# Patient Record
Sex: Male | Born: 1975 | Race: Black or African American | Hispanic: No | Marital: Married | Smoking: Never smoker
Health system: Southern US, Community
[De-identification: ages and names within clinical notes are randomized; demographics above are authoritative.]

## PROBLEM LIST (undated history)

## (undated) DIAGNOSIS — I10 Essential (primary) hypertension: Secondary | ICD-10-CM

## (undated) DIAGNOSIS — Z8619 Personal history of other infectious and parasitic diseases: Secondary | ICD-10-CM

## (undated) DIAGNOSIS — E119 Type 2 diabetes mellitus without complications: Secondary | ICD-10-CM

## (undated) HISTORY — PX: HERNIA REPAIR: SHX51

## (undated) HISTORY — DX: Type 2 diabetes mellitus without complications: E11.9

## (undated) HISTORY — DX: Personal history of other infectious and parasitic diseases: Z86.19

---

## 2005-08-21 ENCOUNTER — Other Ambulatory Visit: Payer: Self-pay

## 2005-08-21 ENCOUNTER — Emergency Department: Payer: Self-pay | Admitting: Emergency Medicine

## 2008-10-25 HISTORY — PX: OTHER SURGICAL HISTORY: SHX169

## 2011-08-18 DIAGNOSIS — Z22322 Carrier or suspected carrier of Methicillin resistant Staphylococcus aureus: Secondary | ICD-10-CM | POA: Insufficient documentation

## 2011-12-21 LAB — CBC AND DIFFERENTIAL
HEMATOCRIT: 45 % (ref 41–53)
Hemoglobin: 15.2 g/dL (ref 13.5–17.5)
Platelets: 247 10*3/uL (ref 150–399)
WBC: 8.9 10*3/mL

## 2012-11-21 LAB — BASIC METABOLIC PANEL
BUN: 18 mg/dL (ref 4–21)
Creatinine: 0.9 mg/dL (ref 0.6–1.3)
GLUCOSE: 109 mg/dL
Potassium: 4.5 mmol/L (ref 3.4–5.3)
Sodium: 137 mmol/L (ref 137–147)

## 2012-11-21 LAB — LIPID PANEL
Cholesterol: 155 mg/dL (ref 0–200)
HDL: 41 mg/dL (ref 35–70)
LDL CALC: 88 mg/dL
Triglycerides: 131 mg/dL (ref 40–160)

## 2012-11-21 LAB — TSH: TSH: 2.08 u[IU]/mL (ref 0.41–5.90)

## 2012-11-21 LAB — HEPATIC FUNCTION PANEL
ALT: 15 U/L (ref 10–40)
AST: 24 U/L (ref 14–40)

## 2012-11-21 LAB — HEMOGLOBIN A1C: HEMOGLOBIN A1C: 7.3 % — AB (ref 4.0–6.0)

## 2012-11-21 LAB — PSA: PSA: 1.5

## 2012-11-28 LAB — HEMOGLOBIN A1C: Hgb A1c MFr Bld: 5.7 % (ref 4.0–6.0)

## 2013-07-27 ENCOUNTER — Ambulatory Visit: Payer: Self-pay | Admitting: Family Medicine

## 2014-11-25 IMAGING — CR DG HIP COMPLETE 2+V*L*
1 series · 2 of 2 positions shown · non-contrast
Comparison: none

REASON FOR EXAM: pain
COMMENTS:

PROCEDURE:     KDR - KDXR HIP LEFT COMPLETE  - July 27, 2013 [DATE]
RESULT:     AP and lateral views of the left hip reveal the bones to be
adequately mineralized. There is no evidence of a fracture nor dislocation.
The joint space is preserved. There are phleboliths within the pelvis.

[Series 1: ap · 0.17mm/px · 2 of 2 slices shown]
[im 1/2]
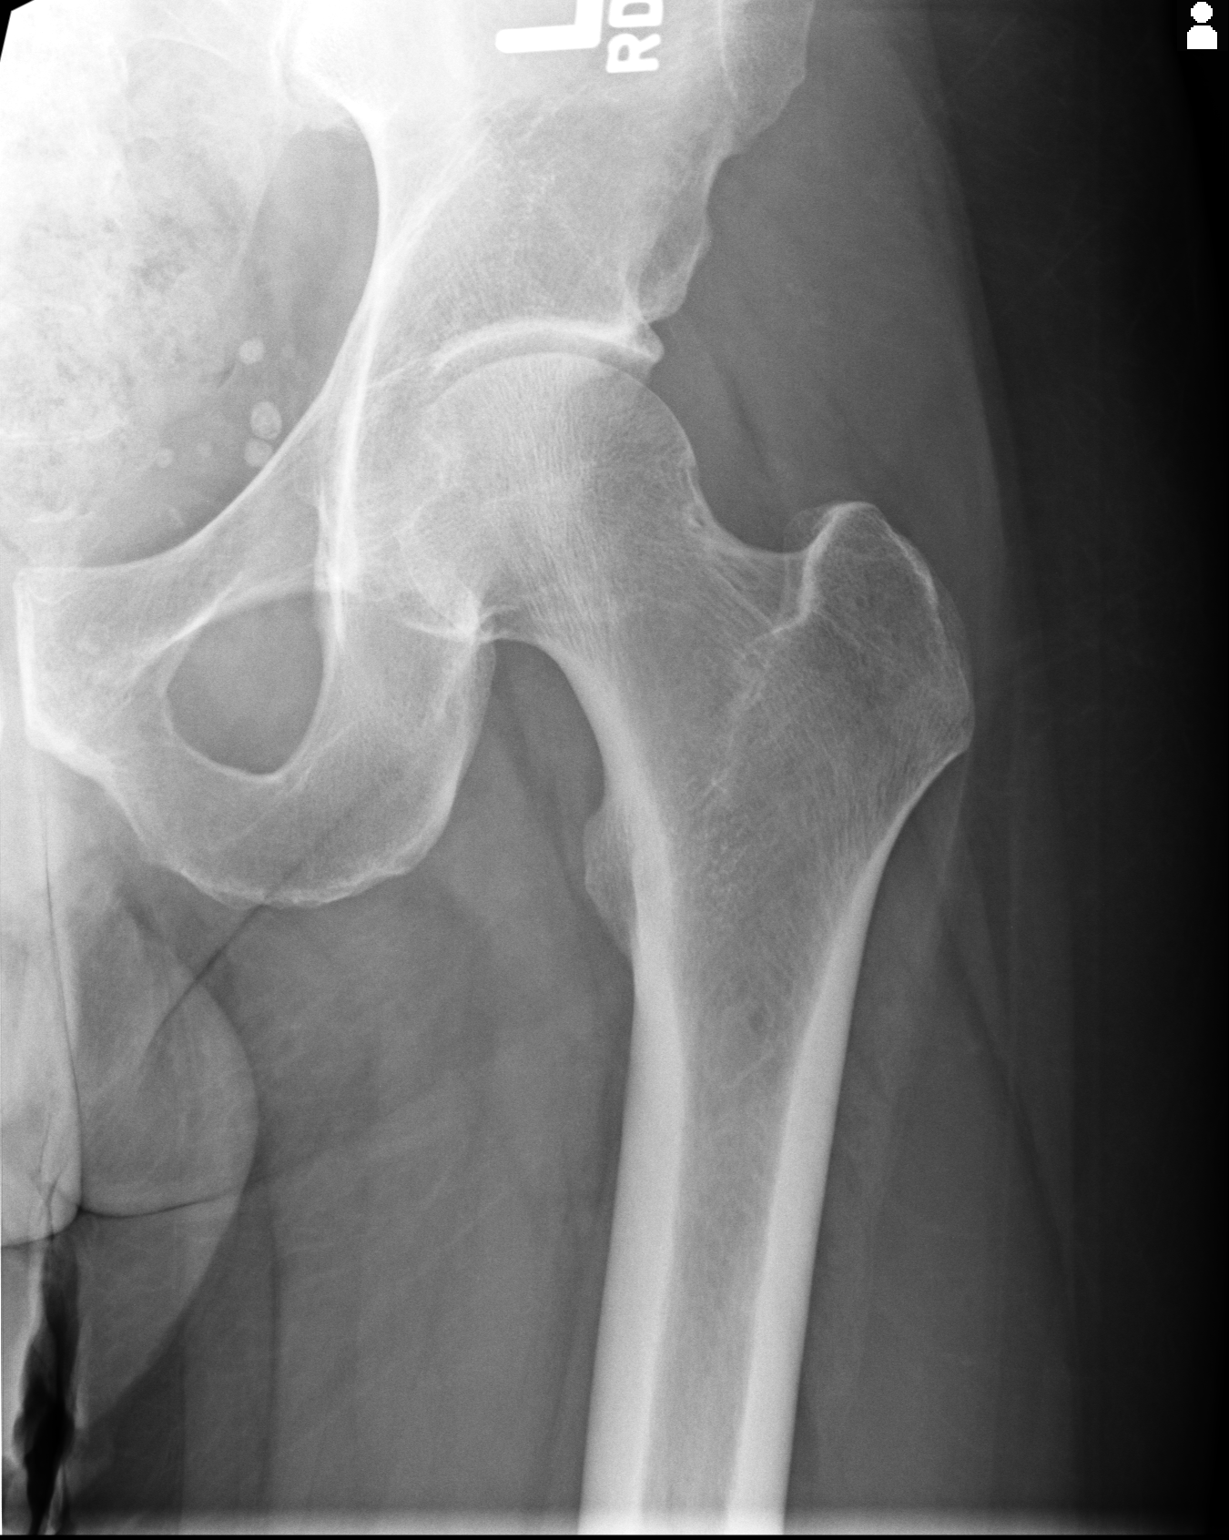
[im 2/2]
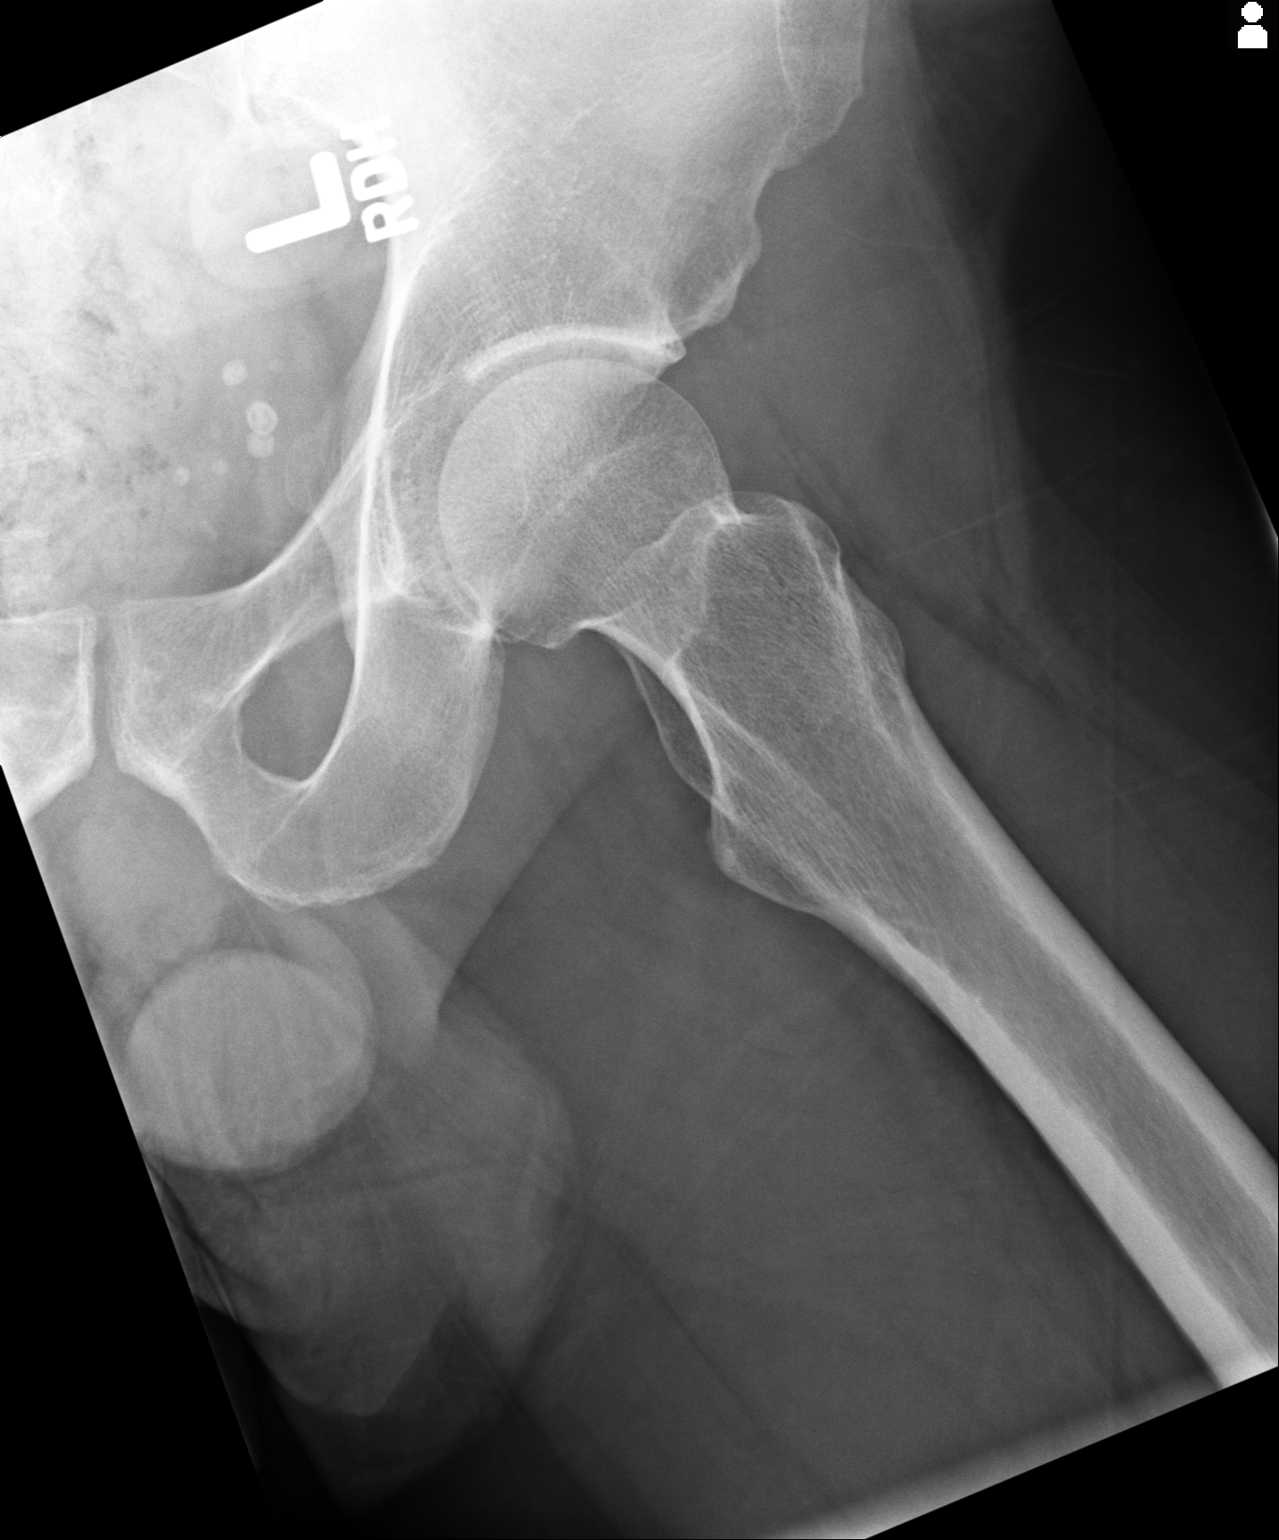

[2 of 2 positions shown; findings below may reference images not displayed]

IMPRESSION: There is no acute bony abnormality of the left hip.

[REDACTED]

## 2015-05-21 ENCOUNTER — Other Ambulatory Visit: Payer: Self-pay | Admitting: Family Medicine

## 2015-07-03 ENCOUNTER — Telehealth: Payer: Self-pay | Admitting: Family Medicine

## 2015-07-03 NOTE — Telephone Encounter (Signed)
Pt called wanitng to know if you would recommend a chiropractor to him.  He wants to know if Dr. Birdie Sons is ok for him to see.  His call back is [phone]  [facility]

## 2015-07-03 NOTE — Telephone Encounter (Signed)
I would recommend Dr. Francisca December

## 2015-07-04 NOTE — Telephone Encounter (Signed)
Patient notified

## 2015-07-29 DIAGNOSIS — Z8619 Personal history of other infectious and parasitic diseases: Secondary | ICD-10-CM | POA: Insufficient documentation

## 2015-07-29 DIAGNOSIS — M25552 Pain in left hip: Secondary | ICD-10-CM | POA: Insufficient documentation

## 2015-07-29 DIAGNOSIS — M549 Dorsalgia, unspecified: Secondary | ICD-10-CM | POA: Insufficient documentation

## 2015-07-29 DIAGNOSIS — R739 Hyperglycemia, unspecified: Secondary | ICD-10-CM | POA: Insufficient documentation

## 2015-07-29 DIAGNOSIS — R011 Cardiac murmur, unspecified: Secondary | ICD-10-CM | POA: Insufficient documentation

## 2015-07-29 DIAGNOSIS — K649 Unspecified hemorrhoids: Secondary | ICD-10-CM | POA: Insufficient documentation

## 2015-07-29 HISTORY — DX: Personal history of other infectious and parasitic diseases: Z86.19

## 2015-07-29 LAB — LIPID PANEL
Albumin, Serum: 4.4
Albumin/Globulin Ratio: 1.5
BASO(ABSOLUTE): 0.1
BUN / CREAT RATIO: 17
Basophil: 1
Bilirubin, Total: 0.2 mg/dL
Calcium, Ser: 9.3
Chloride, Serum: 102
Cholesterol: 142 mg/dL (ref 0–200)
EGFR (African American): 127
EGFR (Non-African Amer.): 110
EOS (ABSOLUTE): 0.3
Eosinophils Relative: 4 % (ref 0–6)
Free Thyroxine Index: 1.9
GGT: 12
GRANS (ABSOLUTE): 0
Globulin, Total: 3
Granulocytes:: 0
HDL: 52 mg/dL (ref 35–70)
IRON: 58
LDH: 158
LDL Cholesterol: 77 mg/dL
LDl/HDL Ratio: 2.7
LYMPHOCYTES RELATIVE % (KUC): 29 % (ref 15–45)
Lymphs(Absolute): 2.3
MCH: 28.4
MCHC: 33
MCV: 86
MONOCYTES RELATIVE % (KUC): 5 % (ref 2–10)
MONOCYTES(ABSOLUTE): 0.4
NEUTROPHILS ABSOLUTE (KUC): 4.8 10*3/uL (ref 1.7–7.7)
Neutrophils: 61
PHOSPHORUS: 3.7
PROTEIN, TOTAL - PEBFLD: 7.4
RBC: 5.22
RDW: 13.7
T3 Uptake: 24
THYROXINE (T4): 8
TRIGLYCERIDES: 65 mg/dL (ref 40–160)
Uric Acid, Serum: 4.2
VLDL CHOLESTEROL CAL: 13

## 2015-07-29 LAB — CBC AND DIFFERENTIAL
HCT: 45 % (ref 41–53)
Hemoglobin: 14.8 g/dL (ref 13.5–17.5)
Platelets: 239 10*3/uL (ref 150–399)
WBC: 7.8 10*3/mL

## 2015-07-29 LAB — TSH: TSH: 2.32 u[IU]/mL (ref 0.41–5.90)

## 2015-07-29 LAB — HEPATIC FUNCTION PANEL
ALK PHOS: 80 U/L (ref 25–125)
ALT: 20 U/L (ref 10–40)
AST: 14 U/L (ref 14–40)

## 2015-07-29 LAB — HEMOGLOBIN A1C: HEMOGLOBIN A1C: 6 % (ref 4.0–6.0)

## 2015-07-29 LAB — BASIC METABOLIC PANEL
BUN: 14 mg/dL (ref 4–21)
CREATININE: 0.8 mg/dL (ref 0.6–1.3)
GLUCOSE: 99 mg/dL
POTASSIUM: 4.4 mmol/L (ref 3.4–5.3)
Sodium: 143 mmol/L (ref 137–147)

## 2015-07-29 LAB — PSA: PSA: 1.4

## 2015-07-30 ENCOUNTER — Ambulatory Visit (INDEPENDENT_AMBULATORY_CARE_PROVIDER_SITE_OTHER): Payer: PRIVATE HEALTH INSURANCE | Admitting: Family Medicine

## 2015-07-30 ENCOUNTER — Encounter: Payer: Self-pay | Admitting: Family Medicine

## 2015-07-30 VITALS — BP 142/70 | HR 72 | Temp 98.3°F | Resp 16 | Ht 68.0 in | Wt 196.0 lb

## 2015-07-30 DIAGNOSIS — Z0000 Encounter for general adult medical examination without abnormal findings: Secondary | ICD-10-CM

## 2015-07-30 DIAGNOSIS — E119 Type 2 diabetes mellitus without complications: Secondary | ICD-10-CM

## 2015-07-30 LAB — POCT UA - MICROALBUMIN: MICROALBUMIN (UR) POC: 20 mg/L

## 2015-07-30 NOTE — Progress Notes (Signed)
Patient: Jonathan Berg, Male    DOB: 11/25/1975, 39 y.o.   MRN: [id number] Visit Date: 07/30/2015  Today's Provider: Lelon Huh, MD   Chief Complaint  Patient presents with  . Annual Exam  . Diabetes    follow up   Subjective:    Annual physical exam Jonathan Berg is a 39 y.o. male who presents today for health maintenance and complete physical. He feels fairly well. He reports no regular exercise.  Marland Kitchen He reports he is sleeping poorly.  -----------------------------------------------------------------  Diabetes Mellitus Type II, Follow-up:   Lab Results  Component Value Date   HGBA1C 5.7 11/28/2012   HGBA1C 7.3* 11/21/2012    Last seen for diabetes 2 years ago.  Management since then includes advising patient to watch his diet. He reports good compliance with treatment. He is not having side effects.  Current symptoms include none and have been stable. Home blood sugar records: not being checked  Episodes of hypoglycemia? no   Current Insulin Regimen:  none Most Recent Eye Exam: 09/2014 Weight trend: stable Prior visit with dietician: no Current diet: in general, a "healthy" diet   Current exercise: none  Pertinent Labs:    Component Value Date/Time   CHOL 155 11/21/2012   TRIG 131 11/21/2012   CREATININE 0.9 11/21/2012    Wt Readings from Last 3 Encounters:  07/30/15 196 lb (88.905 kg)  07/27/13 195 lb (88.451 kg)   Patient reports he had blood drawn for labs yesterday through his Employment. He states they checked A1c,PSA and cholesterol, but he hasn't received results yet.  ------------------------------------------------------------------------    Review of Systems  Constitutional: Negative for fever, chills, appetite change and fatigue.  HENT: Negative for congestion, ear pain, hearing loss, nosebleeds and trouble swallowing.   Eyes: Negative for pain and visual disturbance.  Respiratory: Negative for cough, chest tightness and shortness  of breath.   Cardiovascular: Negative for chest pain, palpitations and leg swelling.  Gastrointestinal: Negative for nausea, vomiting, abdominal pain, diarrhea, constipation and blood in stool.  Endocrine: Negative for polydipsia, polyphagia and polyuria.  Genitourinary: Negative for dysuria and flank pain.  Musculoskeletal: Positive for back pain. Negative for myalgias, joint swelling, arthralgias and neck stiffness.  Skin: Negative for color change, rash and wound.  Neurological: Negative for dizziness, tremors, seizures, speech difficulty, weakness, light-headedness and headaches.  Psychiatric/Behavioral: Positive for sleep disturbance (trouble staying asleep). Negative for behavioral problems, confusion, dysphoric mood and decreased concentration. The patient is not nervous/anxious.   All other systems reviewed and are negative.   Social History He  reports that he has never smoked. He does not have any smokeless tobacco history on file. He reports that he does not drink alcohol or use illicit drugs. Social History   Social History  . Marital Status: Single    Spouse Name: N/A  . Number of Children: 3  . Years of Education: N/A   Occupational History  . Technician    Social History Main Topics  . Smoking status: Never Smoker   . Smokeless tobacco: None  . Alcohol Use: No  . Drug Use: No     Comment: History of marijuana 10 yrs ago  . Sexual Activity: Not Asked   Other Topics Concern  . None   Social History Narrative    Patient Active Problem List   Diagnosis Date Noted  . Back ache 07/29/2015  . History of chicken pox 07/29/2015  . Diabetes mellitus ([place]) 07/29/2015  .  Hemorrhoid 07/29/2015  . Left hip pain 07/29/2015  . Undiagnosed cardiac murmurs 07/29/2015  . MRSA (methicillin resistant staph aureus) culture positive 08/18/2011  . Thoracic back sprain 06/06/2009    Past Surgical History  Procedure Laterality Date  . History of removal of cyst  2010    on  back  . History of wisdom tooth extraction    . Hernia repair      Inguinal; removal as a child    Family History  Family Status  Relation Status Death Age  . Mother Alive     gastric ulcer  . Father Alive   . Daughter Alive   . Maternal Aunt Alive   . Daughter Alive    His family history includes Diabetes in his maternal aunt; Hypertension in his father and mother.    Allergies  Allergen Reactions  . Aspirin Swelling    facial  . Ibuprofen     Previous Medications   ACETAMINOPHEN (TYLENOL) 500 MG TABLET    Take 1 tablet by mouth every 6 (six) hours as needed.   HIBICLENS 4 % EXTERNAL LIQUID    APPLY TO AFFECTED ARE EVERY DAY AS NEEDED    Patient Care Team: Birdie Sons, MD as PCP - General (Family Medicine)     Objective:   Vitals: BP 142/70 mmHg  Pulse 72  Temp(Src) 98.3 F (36.8 C) (Oral)  Resp 16  Ht 5\' 8"  (1.727 m)  Wt 196 lb (88.905 kg)  BMI 29.81 kg/m2  SpO2 98%   Physical Exam   General Appearance:    Alert, cooperative, no distress, appears stated age  Head:    Normocephalic, without obvious abnormality, atraumatic  Eyes:    PERRL, conjunctiva/corneas clear, EOM's intact, fundi    benign, both eyes       Ears:    Normal TM's and external ear canals, both ears  Nose:   Nares normal, septum midline, mucosa normal, no drainage   or sinus tenderness  Throat:   Lips, mucosa, and tongue normal; teeth and gums normal  Neck:   Supple, symmetrical, trachea midline, no adenopathy;       thyroid:  No enlargement/tenderness/nodules; no carotid   bruit or JVD  Back:     Symmetric, no curvature, ROM normal, no CVA tenderness  Lungs:     Clear to auscultation bilaterally, respirations unlabored  Chest wall:    No tenderness or deformity  Heart:    Regular rate and rhythm, S1 and S2 normal, no murmur, rub   or gallop  Abdomen:     Soft, non-tender, bowel sounds active all four quadrants,    no masses, no organomegaly  Genitalia:    penis: no lesions or  discharge. testes: no masses or tenderness. no hernias  Rectal:    deferred  Extremities:   Extremities normal, atraumatic, no cyanosis or edema  Pulses:   2+ and symmetric all extremities  Skin:   Skin color, texture, turgor normal, no rashes or lesions  Lymph nodes:   Cervical, supraclavicular, and axillary nodes normal  Neurologic:   CNII-XII intact. Normal strength, sensation and reflexes      throughout    Depression Screen PHQ 2/9 Scores 07/30/2015  PHQ - 2 Score 0  PHQ- 9 Score 2      Assessment & Plan:     Routine Health Maintenance and Physical Exam  Exercise Activities and Dietary recommendations Goals    None      Immunization History  Administered Date(s) Administered  . Tdap 11/19/2009    Health Maintenance  Topic Date Due  . OPHTHALMOLOGY EXAM  09/18/1984  . URINE MICROALBUMIN  09/18/1984  . HEMOGLOBIN A1C  05/28/2013  . INFLUENZA VACCINE  01/24/2016 (Originally 05/26/2015)  . TETANUS/TDAP  11/20/2019      Discussed health benefits of physical activity, and encouraged him to engage in regular exercise appropriate for his age and condition.    --------------------------------------------------------------------

## 2015-07-31 ENCOUNTER — Telehealth: Payer: Self-pay | Admitting: Family Medicine

## 2015-07-31 NOTE — Telephone Encounter (Signed)
Unable to find results, pt advised that we will call him with results when they come in.  Thanks,

## 2015-07-31 NOTE — Telephone Encounter (Signed)
Pt is requesting results from lab work.  CB#[phone]/MW

## 2015-07-31 NOTE — Telephone Encounter (Signed)
Please advise results? Does not appear to have had any labs done or ordered.

## 2015-08-01 ENCOUNTER — Telehealth: Payer: Self-pay | Admitting: Family Medicine

## 2015-08-01 ENCOUNTER — Encounter: Payer: Self-pay | Admitting: *Deleted

## 2015-08-01 NOTE — Telephone Encounter (Signed)
Pt is requesting a call back to discuss test results.  CB#[phone]/MW

## 2015-08-01 NOTE — Telephone Encounter (Signed)
A1c is 6.0. Cholesterol is very good at 142. Normal kidney and liver functions. Normal PSA. Follow up o.v. And A1c in 6 months.

## 2015-08-01 NOTE — Telephone Encounter (Signed)
Returned patients call. Patient had question about is A1c number.

## 2015-08-01 NOTE — Telephone Encounter (Signed)
Patient notified of results. Patient expressed understanding.  

## 2015-08-20 ENCOUNTER — Other Ambulatory Visit: Payer: Self-pay | Admitting: Family Medicine

## 2015-11-15 ENCOUNTER — Other Ambulatory Visit: Payer: Self-pay | Admitting: Family Medicine

## 2016-05-07 ENCOUNTER — Ambulatory Visit: Payer: PRIVATE HEALTH INSURANCE | Admitting: Family Medicine

## 2016-05-13 ENCOUNTER — Ambulatory Visit: Payer: PRIVATE HEALTH INSURANCE | Admitting: Family Medicine

## 2016-05-14 ENCOUNTER — Ambulatory Visit (INDEPENDENT_AMBULATORY_CARE_PROVIDER_SITE_OTHER): Payer: PRIVATE HEALTH INSURANCE | Admitting: Family Medicine

## 2016-05-14 VITALS — BP 136/82 | HR 68 | Temp 98.4°F | Resp 16 | Wt 197.0 lb

## 2016-05-14 DIAGNOSIS — G5602 Carpal tunnel syndrome, left upper limb: Secondary | ICD-10-CM | POA: Diagnosis not present

## 2016-05-14 NOTE — Patient Instructions (Addendum)
Discussed use of Tylenol extra strength 500 mg. Two pills 3 x day along with a cockup wrist splint. Cross train and focus on your lower body exercises.

## 2016-05-14 NOTE — Progress Notes (Signed)
Subjective:     Patient ID: Jonathan Manis., male   DOB: 1975-11-02, 40 y.o.   MRN: [id number]  HPI  Chief Complaint  Patient presents with  . Numbness    Two left fingers.  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 In the last month he has started working out using machine and free weights. He has noticed that the first three fingers of his left hand have gotten numb and tingly. Denies decreased strength.   Review of Systems     Objective:   Physical Exam  Constitutional: He appears well-developed and well-nourished. No distress.  Musculoskeletal:  Left hand grip 5/5. +Phalen's test.       Assessment:    1. Carpal tunnel syndrome of left wrist    Plan:    Wrote rx for cockup wrist splint and discussed use of Tylenol. Recommended focusing on lower body exercises. Consider orthopedic referral if not improving in the next two weeks.

## 2016-05-23 ENCOUNTER — Other Ambulatory Visit: Payer: Self-pay | Admitting: Family Medicine

## 2016-08-02 ENCOUNTER — Encounter: Payer: PRIVATE HEALTH INSURANCE | Admitting: Family Medicine

## 2016-08-09 ENCOUNTER — Ambulatory Visit (INDEPENDENT_AMBULATORY_CARE_PROVIDER_SITE_OTHER): Payer: PRIVATE HEALTH INSURANCE | Admitting: Family Medicine

## 2016-08-09 ENCOUNTER — Encounter: Payer: Self-pay | Admitting: Family Medicine

## 2016-08-09 VITALS — BP 138/82 | HR 68 | Temp 98.1°F | Resp 16 | Wt 199.0 lb

## 2016-08-09 DIAGNOSIS — G5602 Carpal tunnel syndrome, left upper limb: Secondary | ICD-10-CM

## 2016-08-09 MED ORDER — PREDNISONE 20 MG PO TABS: 20.0000 mg | ORAL_TABLET | Freq: Two times a day (BID) | ORAL | 1 refills | Status: DC

## 2016-08-09 NOTE — Progress Notes (Signed)
       Patient: Jonathan Berg. Male    DOB: [birth year]   40 y.o.   MRN: [id number] Visit Date: 08/09/2016  Today's Provider: Lelon Huh, MD   Chief Complaint  Patient presents with  . Hand Pain   Subjective:    Hand Pain   Incident onset: several months ago. There was no injury mechanism. Pertinent negatives include no chest pain.  Patient was last seen 3 months ago by Jonathan Berg for Carpal Tunnel syndrome of left wrist. Patient was advised to use a wrist splint and use Tylenol. Patient has been using the Tylenol with no relief. He did not get a wrist splint because he didn't know which one to get. He is allergy to ASA so has not taken any aspirin. Patient states pain in his wrist has worsened to where is unable to make a tight fist. Now having pain into lateral aspect of right palm.       Allergies  Allergen Reactions  . Aspirin Swelling    facial  . Ibuprofen      Current Outpatient Prescriptions:  .  [facility] NON-ASPIRIN EXTRA STRENGTH 500 MG tablet, TAKE 1 TABLET BY MOUTH EVERY 6 HOURS AS NEEDED, Disp: 250 tablet, Rfl: 1 .  HIBICLENS 4 % external liquid, APPLY TO AFFECTED ARE EVERY DAY AS NEEDED, Disp: 1892 mL, Rfl: 0  Review of Systems  Constitutional: Negative for appetite change, chills and fever.  Respiratory: Negative for chest tightness, shortness of breath and wheezing.   Cardiovascular: Negative for chest pain and palpitations.  Gastrointestinal: Negative for abdominal pain, nausea and vomiting.  Musculoskeletal: Positive for arthralgias.       Stiffness in left hand    Social History  Substance Use Topics  . Smoking status: Never Smoker  . Smokeless tobacco: Never Used  . Alcohol use No   Objective:   BP 138/82 (BP Location: Right Arm, Patient Position: Sitting, Cuff Size: Large)   Pulse 68   Temp 98.1 F (36.7 C) (Oral)   Resp 16   Wt 199 lb (90.3 kg)   BMI 30.26 kg/m   Physical Exam  Positive Tinels on left Negative phalens on  lett Tender over left distal 5th metacarpal.  S/s intact in hand and wrist.     Assessment & Plan:     1. Carpal tunnel syndrome of left wrist Suspected diagnosis. Cannot take NSAIDs. He would like to move forward with orthopedic evaluation. Will also try course of predisone 20mg  twice a day.   - AMB referral to orthopedics       Lelon Huh, MD  [facility]

## 2016-09-02 ENCOUNTER — Encounter: Payer: PRIVATE HEALTH INSURANCE | Admitting: Family Medicine

## 2016-09-10 LAB — HEMOGLOBIN A1C: HEMOGLOBIN A1C: 5.7

## 2016-09-10 LAB — LIPID PANEL
CHOLESTEROL: 179 mg/dL (ref 0–200)
HDL: 59 mg/dL (ref 35–70)
LDL CALC: 110 mg/dL
Triglycerides: 48 mg/dL (ref 40–160)

## 2016-09-10 LAB — BASIC METABOLIC PANEL
BUN: 20 mg/dL (ref 4–21)
Creatinine: 0.9 mg/dL (ref 0.6–1.3)
GLUCOSE: 103 mg/dL
POTASSIUM: 4.4 mmol/L (ref 3.4–5.3)
Sodium: 141 mmol/L (ref 137–147)

## 2016-09-10 LAB — CBC AND DIFFERENTIAL
HEMATOCRIT: 42 % (ref 41–53)
Hemoglobin: 14.7 g/dL (ref 13.5–17.5)
PLATELETS: 281 10*3/uL (ref 150–399)
WBC: 6.6 10^3/mL

## 2016-09-10 LAB — HEPATIC FUNCTION PANEL
ALT: 18 U/L (ref 10–40)
AST: 17 U/L (ref 14–40)

## 2016-09-10 LAB — PSA: PSA: 1.7

## 2016-09-10 LAB — TSH: TSH: 1.59 u[IU]/mL (ref 0.41–5.90)

## 2016-09-11 LAB — CBC: RBC: 4.93

## 2016-09-11 LAB — CHG GENERAL HEALTH PANEL: URIC ACID, SERUM: 4.2

## 2016-09-13 ENCOUNTER — Telehealth: Payer: Self-pay | Admitting: Family Medicine

## 2016-09-13 MED ORDER — CIPROFLOXACIN HCL 0.3 % OP SOLN: 2.0000 [drp] | OPHTHALMIC | 0 refills | Status: DC

## 2016-09-13 NOTE — Telephone Encounter (Signed)
Patient was advised. KW 

## 2016-09-13 NOTE — Telephone Encounter (Signed)
Please advise if you would prefer patient to come in for office visit? KW

## 2016-09-13 NOTE — Telephone Encounter (Signed)
[facility]

## 2016-09-13 NOTE — Telephone Encounter (Signed)
Pt called saying his son has had pink eye and he thinks he has caught  It.  His eyes are red, itching ect.  No other symptoms.  He uses [facility].  Thank sTeri

## 2016-09-15 ENCOUNTER — Ambulatory Visit (INDEPENDENT_AMBULATORY_CARE_PROVIDER_SITE_OTHER): Payer: PRIVATE HEALTH INSURANCE | Admitting: Family Medicine

## 2016-09-15 ENCOUNTER — Ambulatory Visit: Payer: PRIVATE HEALTH INSURANCE | Admitting: Family Medicine

## 2016-09-15 ENCOUNTER — Encounter: Payer: Self-pay | Admitting: Family Medicine

## 2016-09-15 ENCOUNTER — Encounter: Payer: Self-pay | Admitting: *Deleted

## 2016-09-15 VITALS — BP 120/70 | HR 65 | Temp 98.0°F | Resp 16 | Ht 68.0 in | Wt 202.0 lb

## 2016-09-15 DIAGNOSIS — B309 Viral conjunctivitis, unspecified: Secondary | ICD-10-CM | POA: Diagnosis not present

## 2016-09-15 MED ORDER — CIPROFLOXACIN HCL 0.3 % OP SOLN: 2.0000 [drp] | OPHTHALMIC | 0 refills | Status: DC

## 2016-09-15 NOTE — Patient Instructions (Signed)
Message eyelids with small amount of Johnson's Baby Shampoo every morning   Viral Conjunctivitis, Adult Viral conjunctivitis is an inflammation of the clear membrane that covers the white part of your eye and the inner surface of your eyelid (conjunctiva). The inflammation is caused by a viral infection. The blood vessels in the conjunctiva become inflamed, causing the eye to become red or pink, and often itchy. Viral conjunctivitis can be easily passed from one person to another (is contagious). This condition is often called pink eye. What are the causes? This condition is caused by a virus. A virus is a type of contagious germ. It can be spread by touching objects that have been contaminated with the virus, such as doorknobs or towels. It can also be passed through droplets, such as from coughing or sneezing. What are the signs or symptoms? Symptoms of this condition include:  Eye redness.  Tearing or watery eyes.  Itchy and irritated eyes.  Burning feeling in the eyes.  Clear drainage from the eye.  Swollen eyelids.  A gritty feeling in the eye.  Light sensitivity. This condition often occurs with other symptoms, such as a fever, nausea, or a rash. How is this diagnosed? This condition is diagnosed with a medical history and physical exam. If you have discharge from your eye, the discharge may be tested to rule out other causes of conjunctivitis. How is this treated? Viral conjunctivitis does not respond to medicines that kill bacteria (antibiotics). Treatment for viral conjunctivitis is directed at stopping a bacterial infection from developing in addition to the viral infection. Treatment also aims to relieve your symptoms, such as itching. This may be done with antihistamine drops or other eye medicines. Rarely, steroid eye drops or antiviral medicines may be prescribed. Follow these instructions at home: Medicines  Take or apply over-the-counter and prescription medicines  only as told by your health care provider.  Be very careful to avoid touching the edge of the eyelid with the eye drop bottle or ointment tube when applying medicines to the affected eye. Being careful this way will stop you from spreading the infection to the other eye or to other people. Eye care  Avoid touching or rubbing your eyes.  Apply a warm, wet, clean washcloth to your eye for 10-20 minutes, 3-4 times per day or as told by your health care provider.  If you wear contact lenses, do not wear them until the inflammation is gone and your health care provider says it is safe to wear them again. Ask your health care provider how to sterilize or replace your contact lenses before using them again. Wear glasses until you can resume wearing contacts.  Avoid wearing eye makeup until the inflammation is gone. Throw away any old eye cosmetics that may be contaminated.  Gently wipe away any drainage from your eye with a warm, wet washcloth or a cotton ball. General instructions  Change or wash your pillowcase every day or as told by your health care provider.  Do not share towels, pillowcases, washcloths, eye makeup, makeup brushes, contact lenses, or glasses. This may spread the infection.  Wash your hands often with soap and water. Use paper towels to dry your hands. If soap and water are not available, use hand sanitizer.  Try to avoid contact with other people for one week or as told by your health care provider. Contact a health care provider if:  Your symptoms do not improve with treatment or they get worse.  You  have increased pain.  Your vision becomes blurry.  You have a fever.  You have facial pain, redness, or swelling.  You have yellow or green drainage coming from your eye.  You have new symptoms. This information is not intended to replace advice given to you by your health care provider. Make sure you discuss any questions you have with your health care  provider. Document Released: 01/01/2003 Document Revised: 05/08/2016 Document Reviewed: 04/27/2016 Elsevier Interactive Patient Education  2017 [employer].

## 2016-09-15 NOTE — Progress Notes (Signed)
       Patient: Jonathan Berg. Male    DOB: 1976/01/07   40 y.o.   MRN: [id number] Visit Date: 09/15/2016  Today's Provider: Lelon Huh, MD   Chief Complaint  Patient presents with  . Conjunctivitis   Subjective:    Pt called office 09/13/2016 saying his son has had pink eye and he thinks he had caught it.  His eyes are red and itching. No other symptoms. Patient was prescribed ciprofloxacin 1.3% ophthalmic drops. Patient states his eyes have only slightly improved.   Conjunctivitis   Associated symptoms include eye itching, eye discharge and eye redness. Pertinent negatives include no fever, no abdominal pain, no nausea, no vomiting and no wheezing.   Denies any eye pain, no photosensitive. Having a little bit of sinus drainage.     Allergies  Allergen Reactions  . Aspirin Swelling    facial  . Ibuprofen      Current Outpatient Prescriptions:  .  ciprofloxacin (CILOXAN) 0.3 % ophthalmic solution, Place 2 drops into the left eye every 4 (four) hours while awake., Disp: 5 mL, Rfl: 0 .  [facility] NON-ASPIRIN EXTRA STRENGTH 500 MG tablet, TAKE 1 TABLET BY MOUTH EVERY 6 HOURS AS NEEDED, Disp: 250 tablet, Rfl: 1 .  HIBICLENS 4 % external liquid, APPLY TO AFFECTED ARE EVERY DAY AS NEEDED, Disp: 1892 mL, Rfl: 0  Review of Systems  Constitutional: Negative for appetite change, chills and fever.  Eyes: Positive for discharge, redness and itching.  Respiratory: Negative for chest tightness, shortness of breath and wheezing.   Cardiovascular: Negative for chest pain and palpitations.  Gastrointestinal: Negative for abdominal pain, nausea and vomiting.    Social History  Substance Use Topics  . Smoking status: Never Smoker  . Smokeless tobacco: Never Used  . Alcohol use No   Objective:   BP 120/70 (BP Location: Right Arm, Patient Position: Sitting, Cuff Size: Large)   Pulse 65   Temp 98 F (36.7 C) (Oral)   Resp 16   Ht 5\' 8"  (1.727 m)   Wt 202 lb (91.6 kg)   SpO2 98%    BMI 30.71 kg/m   Physical Exam  General Appearance:    Alert, cooperative, no distress  HENT:   bilateral TM normal without fluid or infection, neck without nodes, sinuses nontender and nasal mucosa pale and congested  Eyes:    PERRL, EOM's intact, mild injection of both conjunctive L>R. Small amount of clear discharge.   Lungs:     Clear to auscultation bilaterally, respirations unlabored  Heart:    Regular rate and rhythm  Neurologic:   Awake, alert, oriented x 3. No apparent focal neurological           defect.           Assessment & Plan:     1. Viral conjunctivitis of left eye Counseled on natural history of viral conjunctivitis and to continue using Cipro drops to prevent progression to bacterial infection. Patient information printed. Call if any eye pain, green discharge or photosensitivity.        Lelon Huh, MD  [facility]

## 2016-09-16 ENCOUNTER — Other Ambulatory Visit: Payer: Self-pay | Admitting: Family Medicine

## 2016-09-24 ENCOUNTER — Ambulatory Visit (INDEPENDENT_AMBULATORY_CARE_PROVIDER_SITE_OTHER): Payer: PRIVATE HEALTH INSURANCE | Admitting: Family Medicine

## 2016-09-24 ENCOUNTER — Encounter: Payer: Self-pay | Admitting: Family Medicine

## 2016-09-24 VITALS — BP 110/70 | HR 63 | Temp 98.2°F | Resp 16 | Ht 68.0 in | Wt 203.0 lb

## 2016-09-24 DIAGNOSIS — Z0000 Encounter for general adult medical examination without abnormal findings: Secondary | ICD-10-CM

## 2016-09-24 NOTE — Patient Instructions (Signed)

## 2016-09-24 NOTE — Progress Notes (Signed)
Patient: Jonathan Berg., Male    DOB: Dec 26, 1975, 40 y.o.   MRN: [id number] Visit Date: 09/24/2016  Today's Provider: Lelon Huh, MD   Chief Complaint  Patient presents with  . Annual Exam  . Diabetes   Subjective:    Annual physical exam Jonathan Berg. is a 40 y.o. male who presents today for health maintenance and complete physical. He feels well. He reports exercising yes. He reports he is sleeping fairly well. He had extensive labs done through work which were all normal including me  -----------------------------------------------------------------   Diabetes Mellitus Type II, Follow-up:   Lab Results  Component Value Date   HGBA1C 5.7 09/10/2016   HGBA1C 6.0 07/29/2015   HGBA1C 5.7 11/28/2012   Last seen for diabetes 13 months ago.  Management since then includes; no changes. He reports good compliance with treatment. He is not having side effects. none Current symptoms include none and have been unchanged. Home blood sugar records: fasting range: not checking  Episodes of hypoglycemia? no   Current Insulin Regimen: n/a Most Recent Eye Exam: 6 months ago Weight trend: stable Prior visit with dietician: no Current diet: well balanced Current exercise: walking  ------------------------------------------------------------------     Review of Systems  Constitutional: Negative for appetite change, chills, fatigue and fever.  HENT: Negative for congestion, ear pain, hearing loss, nosebleeds and trouble swallowing.   Eyes: Negative for pain and visual disturbance.  Respiratory: Negative for cough, chest tightness and shortness of breath.   Cardiovascular: Negative for chest pain, palpitations and leg swelling.  Gastrointestinal: Negative for abdominal pain, blood in stool, constipation, diarrhea, nausea and vomiting.  Endocrine: Negative for polydipsia, polyphagia and polyuria.  Genitourinary: Negative for dysuria and flank pain.    Musculoskeletal: Positive for back pain. Negative for arthralgias, joint swelling, myalgias and neck stiffness.  Skin: Negative for color change, rash and wound.  Neurological: Negative for dizziness, tremors, seizures, speech difficulty, weakness, light-headedness and headaches.  Psychiatric/Behavioral: Negative for behavioral problems, confusion, decreased concentration, dysphoric mood and sleep disturbance. The patient is not nervous/anxious.   All other systems reviewed and are negative.   Social History      He  reports that he has never smoked. He has never used smokeless tobacco. He reports that he does not drink alcohol or use drugs.       Social History   Social History  . Marital status: Married    Spouse name: N/A  . Number of children: 3  . Years of education: N/A   Occupational History  . Technician    Social History Main Topics  . Smoking status: Never Smoker  . Smokeless tobacco: Never Used  . Alcohol use No  . Drug use: No     Comment: History of marijuana 10 yrs ago  . Sexual activity: Not Asked   Other Topics Concern  . None   Social History Narrative  . None    Past Medical History:  Diagnosis Date  . Diabetes mellitus without complication [facility])      Patient Active Problem List   Diagnosis Date Noted  . Back ache 07/29/2015  . History of chicken pox 07/29/2015  . Diabetes mellitus ([place]) 07/29/2015  . Hemorrhoid 07/29/2015  . Left hip pain 07/29/2015  . Undiagnosed cardiac murmurs 07/29/2015  . MRSA (methicillin resistant staph aureus) culture positive 08/18/2011  . Thoracic back sprain 06/06/2009    Past Surgical History:  Procedure Laterality Date  .  HERNIA REPAIR     Inguinal; removal as a child  . History of removal of Cyst  2010   on back  . History of wisdom tooth extraction      Family History        Family Status  Relation Status  . Mother Alive   gastric ulcer  . Father Alive  . Daughter Alive  . Maternal Aunt Alive   . Daughter Alive        His family history includes Diabetes in his maternal aunt; Hypertension in his father and mother.     Allergies  Allergen Reactions  . Aspirin Swelling    facial  . Ibuprofen      Current Outpatient Prescriptions:  .  [facility]S NON-ASPIRIN EXTRA STRENGTH 500 MG tablet, TAKE 1 TABLET BY MOUTH EVERY 6 HOURS AS NEEDED, Disp: 250 tablet, Rfl: 1   Patient Care Team: Birdie Sons, MD as PCP - General (Family Medicine)      Objective:   Vitals: BP 110/70 (BP Location: Right Arm, Patient Position: Sitting, Cuff Size: Large)   Pulse 63   Temp 98.2 F (36.8 C) (Oral)   Resp 16   Ht 5\' 8"  (1.727 m)   Wt 203 lb (92.1 kg)   SpO2 97%   BMI 30.87 kg/m    Physical Exam   General Appearance:    Alert, cooperative, no distress, appears stated age  Head:    Normocephalic, without obvious abnormality, atraumatic  Eyes:    PERRL, conjunctiva/corneas clear, EOM's intact, fundi    benign, both eyes       Ears:    Normal TM's and external ear canals, both ears  Nose:   Nares normal, septum midline, mucosa normal, no drainage   or sinus tenderness  Throat:   Lips, mucosa, and tongue normal; teeth and gums normal  Neck:   Supple, symmetrical, trachea midline, no adenopathy;       thyroid:  No enlargement/tenderness/nodules; no carotid   bruit or JVD  Back:     Symmetric, no curvature, ROM normal, no CVA tenderness  Lungs:     Clear to auscultation bilaterally, respirations unlabored  Chest wall:    No tenderness or deformity  Heart:    Regular rate and rhythm, S1 and S2 normal, no murmur, rub   or gallop  Abdomen:     Soft, non-tender, bowel sounds active all four quadrants,    no masses, no organomegaly  Genitalia:    deferred  Rectal:    deferred  Extremities:   Extremities normal, atraumatic, no cyanosis or edema  Pulses:   2+ and symmetric all extremities  Skin:   Skin color, texture, turgor normal, no rashes or lesions  Lymph nodes:   Cervical,  supraclavicular, and axillary nodes normal  Neurologic:   CNII-XII intact. Normal strength, sensation and reflexes      throughout    Depression Screen PHQ 2/9 Scores 09/24/2016 07/30/2015  PHQ - 2 Score 0 0  PHQ- 9 Score 0 2      Assessment & Plan:     Routine Health Maintenance and Physical Exam  Exercise Activities and Dietary recommendations Goals    None      Immunization History  Administered Date(s) Administered  . Tdap 11/19/2009    Health Maintenance  Topic Date Due  . PNEUMOCOCCAL POLYSACCHARIDE VACCINE (1) 09/18/1976  . FOOT EXAM  09/18/1984  . OPHTHALMOLOGY EXAM  09/18/1984  . HIV Screening  09/18/1989  .  INFLUENZA VACCINE  05/25/2016  . URINE MICROALBUMIN  07/29/2016  . HEMOGLOBIN A1C  03/10/2017  . TETANUS/TDAP  11/20/2019     Discussed health benefits of physical activity, and encouraged him to engage in regular exercise appropriate for his age and condition.    --------------------------------------------------------------------  1. Annual physical exam Generally doing very well. Continue current medications.      Lelon Huh, MD  [facility]

## 2016-10-17 ENCOUNTER — Other Ambulatory Visit: Payer: Self-pay | Admitting: Family Medicine

## 2017-02-05 ENCOUNTER — Other Ambulatory Visit: Payer: Self-pay | Admitting: Family Medicine

## 2017-02-14 ENCOUNTER — Ambulatory Visit (INDEPENDENT_AMBULATORY_CARE_PROVIDER_SITE_OTHER): Payer: PRIVATE HEALTH INSURANCE | Admitting: Family Medicine

## 2017-02-14 ENCOUNTER — Encounter: Payer: Self-pay | Admitting: Family Medicine

## 2017-02-14 VITALS — BP 112/74 | HR 69 | Temp 98.2°F | Resp 16 | Ht 68.0 in | Wt 207.0 lb

## 2017-02-14 DIAGNOSIS — M67472 Ganglion, left ankle and foot: Secondary | ICD-10-CM

## 2017-02-14 NOTE — Progress Notes (Signed)
       Patient: Jonathan Berg. Male    DOB: 09/16/1976   41 y.o.   MRN: [id number] Visit Date: 02/14/2017  Today's Provider: Lelon Huh, MD   Chief Complaint  Patient presents with  . Foot Pain   Subjective:    Patient noticed a knot on the top of his left foot over a month ago. Patient stated that the knot is painful when he flexes his toes or puts pressure on it. Patient stated that he has not had an injury to his left foot.    Foot Pain  This is a new problem. The current episode started more than 1 month ago. The problem occurs constantly. The problem has been unchanged. Pertinent negatives include no abdominal pain, anorexia, arthralgias, change in bowel habit, chest pain, chills, congestion, coughing, diaphoresis, fatigue, fever, headaches, joint swelling, myalgias, nausea, neck pain, numbness, rash, sore throat, swollen glands, urinary symptoms, vertigo, visual change, vomiting or weakness. Nothing aggravates the symptoms. He has tried nothing for the symptoms.       Allergies  Allergen Reactions  . Aspirin Swelling    facial  . Ibuprofen      Current Outpatient Prescriptions:  .  CVS NON-ASPIRIN EXTRA STRENGTH 500 MG tablet, TAKE 1 TABLET BY MOUTH EVERY 6 HOURS AS NEEDED, Disp: 250 tablet, Rfl: 5 .  HIBICLENS 4 % external liquid, APPLY TO AFFECTED AREA EVERY DAY AS NEEDED, Disp: 1892 mL, Rfl: 3  Review of Systems  Constitutional: Negative for appetite change, chills, diaphoresis, fatigue and fever.  HENT: Negative for congestion and sore throat.   Respiratory: Negative for cough, chest tightness, shortness of breath and wheezing.   Cardiovascular: Negative for chest pain and palpitations.  Gastrointestinal: Negative for abdominal pain, anorexia, change in bowel habit, nausea and vomiting.  Musculoskeletal: Negative for arthralgias, joint swelling, myalgias and neck pain.  Skin: Negative for rash.  Neurological: Negative for vertigo, weakness, numbness and  headaches.    Social History  Substance Use Topics  . Smoking status: Never Smoker  . Smokeless tobacco: Never Used  . Alcohol use No   Objective:   BP 112/74 (BP Location: Right Arm, Patient Position: Sitting, Cuff Size: Large)   Pulse 69   Temp 98.2 F (36.8 C) (Oral)   Resp 16   Ht 5\' 8"  (1.727 m)   Wt 207 lb (93.9 kg)   SpO2 98%   BMI 31.47 kg/m  Vitals:   02/14/17 0810  BP: 112/74  Pulse: 69  Resp: 16  Temp: 98.2 F (36.8 C)  TempSrc: Oral  SpO2: 98%  Weight: 207 lb (93.9 kg)  Height: 5\' 8"  (1.727 m)     Physical Exam  Non-tender, non-inflamed pea sized semi-soft nodule dorsal aspect of left mid-foot.     Assessment & Plan:     1. Ganglion cyst of left foot (suspected) Discussed options of treatment versus observation. He does have occasional stinging pain around lesion and prefers to see specialist to consider excision of lesion.  - Ambulatory referral to Podiatry  The entirety of the information documented in the History of Present Illness, Review of Systems and Physical Exam were personally obtained by me. Portions of this information were initially documented by April M. Sabra Heck, CMA and reviewed by me for thoroughness and accuracy.        Lelon Huh, MD  [facility]

## 2017-03-08 ENCOUNTER — Ambulatory Visit (INDEPENDENT_AMBULATORY_CARE_PROVIDER_SITE_OTHER): Payer: PRIVATE HEALTH INSURANCE | Admitting: Podiatry

## 2017-03-08 ENCOUNTER — Encounter: Payer: Self-pay | Admitting: Podiatry

## 2017-03-08 DIAGNOSIS — M6740 Ganglion, unspecified site: Secondary | ICD-10-CM | POA: Diagnosis not present

## 2017-03-08 DIAGNOSIS — G5792 Unspecified mononeuropathy of left lower limb: Secondary | ICD-10-CM

## 2017-03-09 NOTE — Progress Notes (Signed)
   HPI: Patient is a 41 year old male presenting with a complaint of a cyst on the dorsum of the left foot that appeared 2 months ago. He reports associated tingling. He denies any pain. He has not done anything to treat his symptoms.    Physical Exam: General: The patient is alert and oriented x3 in no acute distress.  Dermatology: Skin is warm, dry and supple bilateral lower extremities. Negative for open lesions or macerations.  Vascular: Palpable pedal pulses bilaterally. No edema or erythema noted. Capillary refill within normal limits.  Neurological: Epicritic and protective threshold grossly intact bilaterally.   Musculoskeletal Exam: Palpable, non-adhered mass approximately 1 cm in diameter noted to the left dorsal foot. Range of motion within normal limits to all pedal and ankle joints bilateral. Muscle strength 5/5 in all groups bilateral.    Assessment: 1. Ganglion cyst left dorsal foot 2. neuritis left foot   Plan of Care:  1. Patient was evaluated. 2. Injection of 0.5 mLs Celestone Soluspan injected into the left dorsal foot. 3. Return to clinic when necessary if ganglion cyst gets larger. May need surgical excision.    Edrick Kins, DPM [facility]  Dr. Edrick Kins, [address]                                        [place], [place] [address]                Office [phone]  Fax [phone]

## 2017-03-12 MED ORDER — BETAMETHASONE SOD PHOS & ACET 6 (3-3) MG/ML IJ SUSP: 3.0000 mg | Freq: Once | INTRAMUSCULAR | Status: DC

## 2017-05-04 ENCOUNTER — Ambulatory Visit: Payer: PRIVATE HEALTH INSURANCE | Admitting: Family Medicine

## 2017-05-19 ENCOUNTER — Ambulatory Visit: Payer: PRIVATE HEALTH INSURANCE | Admitting: Family Medicine

## 2017-05-19 NOTE — Progress Notes (Deleted)
   Patient: Jonathan Berg. Male    DOB: February 07, 1976   41 y.o.   MRN: [id number] Visit Date: 05/19/2017  Today's Provider: Lelon Huh, MD   No chief complaint on file.  Subjective:    Gastroesophageal Reflux       Previous Medications   CVS NON-ASPIRIN EXTRA STRENGTH 500 MG TABLET    TAKE 1 TABLET BY MOUTH EVERY 6 HOURS AS NEEDED   HIBICLENS 4 % EXTERNAL LIQUID    APPLY TO AFFECTED AREA EVERY DAY AS NEEDED    Review of Systems  Social History  Substance Use Topics  . Smoking status: Never Smoker  . Smokeless tobacco: Never Used  . Alcohol use No   Objective:   There were no vitals taken for this visit.  Physical Exam      Assessment & Plan:       Follow up: No Follow-up on file.

## 2018-02-25 ENCOUNTER — Other Ambulatory Visit: Payer: Self-pay | Admitting: Family Medicine

## 2018-03-28 NOTE — Progress Notes (Signed)
Patient: Jonathan Berg. Male    DOB: 1975-12-12   42 y.o.   MRN: [id number] Visit Date: 03/29/2018  Today's Provider: Lelon Huh, MD   Chief Complaint  Patient presents with  . Sore Throat    x 6 days   Subjective:    Sore Throat   This is a new problem. Episode onset: 6 days ago. The problem has been unchanged. Neither side of throat is experiencing more pain than the other. There has been no fever. Associated symptoms include a hoarse voice, neck pain and trouble swallowing (hurts to swallow). Pertinent negatives include no abdominal pain, congestion, coughing, ear discharge, ear pain, headaches, shortness of breath, swollen glands or vomiting. He has tried acetaminophen for the symptoms. The treatment provided mild relief.  Patient reports his daughter had mono 3-4 months ago.       Allergies  Allergen Reactions  . Aspirin Swelling    facial  . Ibuprofen      Current Outpatient Medications:  .  CVS NON-ASPIRIN EXTRA STRENGTH 500 MG tablet, TAKE 1 TABLET BY MOUTH EVERY 6 HOURS AS NEEDED, Disp: 200 tablet, Rfl: 2 .  HIBICLENS 4 % external liquid, APPLY TO AFFECTED AREA EVERY DAY AS NEEDED, Disp: 1892 mL, Rfl: 3 .  ranitidine (ZANTAC) 150 MG tablet, Take 150 mg by mouth 2 (two) times daily., Disp: , Rfl:   Current Facility-Administered Medications:  .  betamethasone acetate-betamethasone sodium phosphate (CELESTONE) injection 3 mg, 3 mg, Intramuscular, Once, Edrick Kins, DPM  Review of Systems  Constitutional: Negative for appetite change, chills and fever.  HENT: Positive for hoarse voice and trouble swallowing (hurts to swallow). Negative for congestion, ear discharge and ear pain.   Respiratory: Negative for cough, chest tightness, shortness of breath and wheezing.   Cardiovascular: Negative for chest pain and palpitations.  Gastrointestinal: Negative for abdominal pain, nausea and vomiting.  Musculoskeletal: Positive for neck pain.  Neurological:  Negative for headaches.    Social History   Tobacco Use  . Smoking status: Never Smoker  . Smokeless tobacco: Never Used  Substance Use Topics  . Alcohol use: No   Objective:   BP 140/88 (BP Location: Left Arm, Patient Position: Sitting, Cuff Size: Large)   Pulse 92   Temp 98.4 F (36.9 C) (Oral)   Resp 16   Wt 212 lb (96.2 kg)   SpO2 99% Comment: room air  BMI 32.23 kg/m  Vitals:   03/29/18 0828  BP: 140/88  Pulse: 92  Resp: 16  Temp: 98.4 F (36.9 C)  TempSrc: Oral  SpO2: 99%  Weight: 212 lb (96.2 kg)     Physical Exam  General Appearance:    Alert, cooperative, no distress  HENT:   bilateral TM normal without fluid or infection, neck has bilateral anterior cervical nodes enlarged, tonsils red, enlarged, with exudate present and sinuses nontender  Eyes:    PERRL, conjunctiva/corneas clear, EOM's intact       Lungs:     Clear to auscultation bilaterally, respirations unlabored  Heart:    Regular rate and rhythm  Neurologic:   Awake, alert, oriented x 3. No apparent focal neurological           defect.       Results for orders placed or performed in visit on 03/29/18  POCT rapid strep A  Result Value Ref Range   Rapid Strep A Screen Positive (A) Negative  Assessment & Plan:     1. Strep pharyngitis  - POCT rapid strep A - amoxicillin (AMOXIL) 500 MG capsule; Take 1 capsule (500 mg total) by mouth 2 (two) times daily for 10 days.  Dispense: 20 capsule; Refill: 0  Call if symptoms change or if not rapidly improving.          Lelon Huh, MD  [facility]

## 2018-03-29 ENCOUNTER — Ambulatory Visit: Payer: PRIVATE HEALTH INSURANCE | Admitting: Family Medicine

## 2018-03-29 ENCOUNTER — Encounter: Payer: Self-pay | Admitting: Family Medicine

## 2018-03-29 VITALS — BP 140/88 | HR 92 | Temp 98.4°F | Resp 16 | Wt 212.0 lb

## 2018-03-29 DIAGNOSIS — J020 Streptococcal pharyngitis: Secondary | ICD-10-CM

## 2018-03-29 LAB — POCT RAPID STREP A (OFFICE): Rapid Strep A Screen: POSITIVE — AB

## 2018-03-29 MED ORDER — AMOXICILLIN 500 MG PO CAPS: 500.0000 mg | ORAL_CAPSULE | Freq: Two times a day (BID) | ORAL | 0 refills | Status: AC

## 2018-03-29 NOTE — Patient Instructions (Signed)

## 2018-07-16 ENCOUNTER — Other Ambulatory Visit: Payer: Self-pay | Admitting: Family Medicine

## 2018-11-13 ENCOUNTER — Telehealth: Payer: Self-pay

## 2018-11-13 MED ORDER — FAMOTIDINE 20 MG PO TABS: 20.0000 mg | ORAL_TABLET | Freq: Two times a day (BID) | ORAL | 5 refills | Status: DC

## 2018-11-13 NOTE — Telephone Encounter (Signed)
Patient needs Rx to replace Zantac  [facility] [phone]

## 2019-04-13 ENCOUNTER — Ambulatory Visit (INDEPENDENT_AMBULATORY_CARE_PROVIDER_SITE_OTHER): Payer: PRIVATE HEALTH INSURANCE | Admitting: Family Medicine

## 2019-04-13 ENCOUNTER — Encounter: Payer: Self-pay | Admitting: Family Medicine

## 2019-04-13 ENCOUNTER — Ambulatory Visit: Payer: PRIVATE HEALTH INSURANCE | Admitting: Family Medicine

## 2019-04-13 ENCOUNTER — Other Ambulatory Visit: Payer: Self-pay

## 2019-04-13 VITALS — Temp 97.5°F | Wt 195.0 lb

## 2019-04-13 DIAGNOSIS — N529 Male erectile dysfunction, unspecified: Secondary | ICD-10-CM | POA: Diagnosis not present

## 2019-04-13 MED ORDER — TADALAFIL 20 MG PO TABS: 20.0000 mg | ORAL_TABLET | ORAL | 3 refills | Status: DC | PRN

## 2019-04-13 NOTE — Progress Notes (Signed)
       Patient: Jonathan Berg. Male    DOB: July 05, 1976   43 y.o.   MRN: [id number] Visit Date: 04/13/2019  Today's Provider: Lelon Huh, MD   No chief complaint on file.  Subjective:     HPI   Virtual Visit via Video Note  I connected with Jonathan Berg. on 04/13/19 at  9:40 AM EDT by a video enabled telemedicine application and verified that I am speaking with the correct person using two identifiers.  Location: Patient: home Provider: [facility]   I discussed the limitations of evaluation and management by telemedicine and the availability of in person appointments. The patient expressed understanding and agreed to proceed.  He states he has difficulty maintaining adequate erection for intercourse. Denies chest pain, dyspnea, claudications symptoms, stress, anxiety depression, insomnia, or loss of libido. Is interested in trying ED medication.  I discussed the assessment and treatment plan with the patient. The patient was provided an opportunity to ask questions and all were answered. The patient agreed with the plan and demonstrated an understanding of the instructions.    Allergies  Allergen Reactions  . Aspirin Swelling    facial  . Ibuprofen      Current Outpatient Medications:  .  CVS NON-ASPIRIN EXTRA STRENGTH 500 MG tablet, TAKE 1 TABLET BY MOUTH EVERY 6 HOURS AS NEEDED, Disp: 200 tablet, Rfl: 2 .  famotidine (PEPCID) 20 MG tablet, Take 1 tablet (20 mg total) by mouth 2 (two) times daily., Disp: 60 tablet, Rfl: 5 .  HIBICLENS 4 % external liquid, APPLY TO AFFECTED AREA EVERY DAY AS NEEDED, Disp: 1892 mL, Rfl: 2 .  ranitidine (ZANTAC) 150 MG tablet, Take 150 mg by mouth 2 (two) times daily., Disp: , Rfl:   Current Facility-Administered Medications:  .  betamethasone acetate-betamethasone sodium phosphate (CELESTONE) injection 3 mg, 3 mg, Intramuscular, Once, Amalia Hailey, Dorathy Daft, DPM  Review of Systems  Social History   Tobacco Use  .  Smoking status: Never Smoker  . Smokeless tobacco: Never Used  Substance Use Topics  . Alcohol use: No      Objective:   There were no vitals taken for this visit. There were no vitals filed for this visit.   Physical Exam   General appearance: alert, well developed, well nourished, cooperative and in no distress Psych: Appropriate mood and affect. Neurologic: Mental status: Alert, oriented to person, place, and time, thought content appropriate.     Assessment & Plan     1. Erectile dysfunction, unspecified erectile dysfunction type No sx of systemic diseases causing sx. Counseled on options for medication treatments and potential adverse effects. Will try- tadalafil (CIALIS) 20 MG tablet; Take 1 tablet (20 mg total) by mouth every other day as needed for erectile dysfunction.  Dispense: 8 tablet; Refill: 3    The patient was advised to call back or seek an in-person evaluation if the symptoms worsen or if the condition fails to improve as anticipated.  I provided 10 minutes of non-face-to-face time during this encounter.       Lelon Huh, MD  [facility]

## 2019-04-13 NOTE — Patient Instructions (Signed)
.   Please review the attached list of medications and notify my office if there are any errors.   . Please bring all of your medications to every appointment so we can make sure that our medication list is the same as yours.   

## 2019-05-01 ENCOUNTER — Ambulatory Visit: Payer: PRIVATE HEALTH INSURANCE | Admitting: Family Medicine

## 2019-05-07 ENCOUNTER — Other Ambulatory Visit: Payer: Self-pay | Admitting: Family Medicine

## 2019-05-22 ENCOUNTER — Encounter: Payer: Self-pay | Admitting: Family Medicine

## 2019-06-18 ENCOUNTER — Other Ambulatory Visit: Payer: Self-pay | Admitting: Family Medicine

## 2019-07-24 ENCOUNTER — Other Ambulatory Visit: Payer: Self-pay | Admitting: Family Medicine

## 2019-07-24 MED ORDER — HIBICLENS 4 % EX LIQD: Freq: Every day | CUTANEOUS | 4 refills | Status: DC | PRN

## 2019-07-24 NOTE — Progress Notes (Unsigned)
Refill for higher quantity and DAW per pharmacy faxed request

## 2019-07-25 ENCOUNTER — Encounter: Payer: PRIVATE HEALTH INSURANCE | Admitting: Family Medicine

## 2019-09-24 ENCOUNTER — Other Ambulatory Visit: Payer: Self-pay | Admitting: Family Medicine

## 2019-09-24 DIAGNOSIS — Z20822 Contact with and (suspected) exposure to covid-19: Secondary | ICD-10-CM

## 2019-09-25 ENCOUNTER — Other Ambulatory Visit: Payer: Self-pay

## 2019-09-25 DIAGNOSIS — Z20822 Contact with and (suspected) exposure to covid-19: Secondary | ICD-10-CM

## 2019-09-25 NOTE — Progress Notes (Signed)
la 

## 2019-09-27 LAB — NOVEL CORONAVIRUS, NAA: SARS-CoV-2, NAA: NOT DETECTED

## 2019-10-15 ENCOUNTER — Encounter: Payer: PRIVATE HEALTH INSURANCE | Admitting: Family Medicine

## 2019-10-15 NOTE — Progress Notes (Deleted)
Patient: Jonathan Routt., Male    DOB: 19-Mar-1976, 43 y.o.   MRN: [id number] Visit Date: 10/15/2019  Today's Provider: Lelon Huh, MD   No chief complaint on file.  Subjective:     Annual physical exam Jonathan Kilday. is a 43 y.o. male who presents today for health maintenance and complete physical. He feels {DESC; WELL/FAIRLY WELL/POORLY:18703}. He reports exercising ***. He reports he is sleeping {DESC; WELL/FAIRLY WELL/POORLY:18703}.  -----------------------------------------------------------------   Review of Systems  Constitutional: Negative.   HENT: Negative.   Eyes: Negative.   Respiratory: Negative.   Cardiovascular: Negative.   Gastrointestinal: Negative.   Endocrine: Negative.   Genitourinary: Negative.   Musculoskeletal: Negative.   Skin: Negative.   Allergic/Immunologic: Negative.   Neurological: Negative.   Hematological: Negative.   Psychiatric/Behavioral: Negative.     Social History      He  reports that he has never smoked. He has never used smokeless tobacco. He reports that he does not drink alcohol or use drugs.       Social History   Socioeconomic History  . Marital status: Married    Spouse name: Not on file  . Number of children: 3  . Years of education: Not on file  . Highest education level: Not on file  Occupational History  . Occupation: Merchant navy officer  Tobacco Use  . Smoking status: Never Smoker  . Smokeless tobacco: Never Used  Substance and Sexual Activity  . Alcohol use: No  . Drug use: No    Comment: History of marijuana 10 yrs ago  . Sexual activity: Not on file  Other Topics Concern  . Not on file  Social History Narrative  . Not on file   Social Determinants of Health   Financial Resource Strain:   . Difficulty of Paying Living Expenses: Not on file  Food Insecurity:   . Worried About Charity fundraiser in the Last Year: Not on file  . Ran Out of Food in the Last Year: Not on file  Transportation Needs:    . Lack of Transportation (Medical): Not on file  . Lack of Transportation (Non-Medical): Not on file  Physical Activity:   . Days of Exercise per Week: Not on file  . Minutes of Exercise per Session: Not on file  Stress:   . Feeling of Stress : Not on file  Social Connections:   . Frequency of Communication with Friends and Family: Not on file  . Frequency of Social Gatherings with Friends and Family: Not on file  . Attends Religious Services: Not on file  . Active Member of Clubs or Organizations: Not on file  . Attends Archivist Meetings: Not on file  . Marital Status: Not on file    Past Medical History:  Diagnosis Date  . Diabetes mellitus without complication ([place])   . History of chicken pox 07/29/2015   DID have Chicken Pox.       Patient Active Problem List   Diagnosis Date Noted  . Erectile dysfunction 04/13/2019  . Back ache 07/29/2015  . Hyperglycemia 07/29/2015  . Left hip pain 07/29/2015  . Undiagnosed cardiac murmurs 07/29/2015  . MRSA (methicillin resistant staph aureus) culture positive 08/18/2011  . Thoracic back sprain 06/06/2009    Past Surgical History:  Procedure Laterality Date  . HERNIA REPAIR     Inguinal; removal as a child  . History of removal of Cyst  2010   on back  .  History of wisdom tooth extraction      Family History        Family Status  Relation Name Status  . Mother  Alive       gastric ulcer  . Father  Alive  . Daughter  Alive  . [employer]  . Daughter  Alive        His family history includes Diabetes in his maternal aunt; Hypertension in his father and mother.      Allergies  Allergen Reactions  . Aspirin Swelling    facial  . Ibuprofen      Current Outpatient Medications:  .  [facility] PAIN RELIEF 500 MG tablet, TAKE 1 TABLET BY MOUTH EVERY 6 HOURS AS NEEDED, Disp: 200 tablet, Rfl: 2 .  famotidine (PEPCID) 20 MG tablet, TAKE 1 TABLET BY MOUTH TWICE A DAY, Disp: 180 tablet, Rfl: 3 .  HIBICLENS 4 %  external liquid, Apply topically daily as needed., Disp: 473 mL, Rfl: 4 .  ranitidine (ZANTAC) 150 MG tablet, Take 150 mg by mouth 2 (two) times daily., Disp: , Rfl:  .  tadalafil (CIALIS) 20 MG tablet, Take 1 tablet (20 mg total) by mouth every other day as needed for erectile dysfunction., Disp: 8 tablet, Rfl: 3  Current Facility-Administered Medications:  .  betamethasone acetate-betamethasone sodium phosphate (CELESTONE) injection 3 mg, 3 mg, Intramuscular, Once, Edrick Kins, DPM   Patient Care Team: Birdie Sons, MD as PCP - General (Family Medicine)    Objective:    Vitals: There were no vitals taken for this visit.  There were no vitals filed for this visit.   Physical Exam   Depression Screen PHQ 2/9 Scores 03/29/2018 09/24/2016 07/30/2015  PHQ - 2 Score 0 0 0  PHQ- 9 Score - 0 2       Assessment & Plan:     Routine Health Maintenance and Physical Exam  Exercise Activities and Dietary recommendations Goals   None     Immunization History  Administered Date(s) Administered  . Tdap 11/19/2009    Health Maintenance  Topic Date Due  . PNEUMOCOCCAL POLYSACCHARIDE VACCINE AGE 42-64 HIGH RISK  09/18/1976  . FOOT EXAM  09/18/1984  . OPHTHALMOLOGY EXAM  09/18/1984  . HIV Screening  09/18/1989  . URINE MICROALBUMIN  07/29/2016  . HEMOGLOBIN A1C  03/10/2017  . INFLUENZA VACCINE  05/26/2019  . TETANUS/TDAP  11/20/2019     Discussed health benefits of physical activity, and encouraged him to engage in regular exercise appropriate for his age and condition.    --------------------------------------------------------------------    Lelon Huh, MD  [place]

## 2019-12-04 ENCOUNTER — Encounter: Payer: PRIVATE HEALTH INSURANCE | Admitting: Family Medicine

## 2019-12-28 ENCOUNTER — Ambulatory Visit: Payer: PRIVATE HEALTH INSURANCE | Admitting: Family Medicine

## 2020-01-08 ENCOUNTER — Encounter: Payer: Self-pay | Admitting: Podiatry

## 2020-01-08 ENCOUNTER — Other Ambulatory Visit: Payer: Self-pay

## 2020-01-08 ENCOUNTER — Ambulatory Visit: Payer: PRIVATE HEALTH INSURANCE | Admitting: Podiatry

## 2020-01-08 ENCOUNTER — Ambulatory Visit (INDEPENDENT_AMBULATORY_CARE_PROVIDER_SITE_OTHER): Payer: BC Managed Care – PPO

## 2020-01-08 VITALS — Temp 97.9°F

## 2020-01-08 DIAGNOSIS — M722 Plantar fascial fibromatosis: Secondary | ICD-10-CM

## 2020-01-08 MED ORDER — DICLOFENAC SODIUM 75 MG PO TBEC: 75.0000 mg | DELAYED_RELEASE_TABLET | Freq: Two times a day (BID) | ORAL | 1 refills | Status: DC

## 2020-01-08 MED ORDER — METHYLPREDNISOLONE 4 MG PO TBPK: ORAL_TABLET | ORAL | 0 refills | Status: DC

## 2020-01-10 NOTE — Progress Notes (Signed)
   Subjective: 43 y.o. male presenting today as a new patient with a chief complaint of right heel pain that began about one month ago. He states it feels like he is walking on a bruise. He states the pain is worse in the morning and when he stands after being seated for a long period of time. He has been rolling the foot on a frozen water bottle and taking Tylenol for treatment. Patient is here for further evaluation and treatment.   Past Medical History:  Diagnosis Date  . Diabetes mellitus without complication ([place])   . History of chicken pox 07/29/2015   DID have Chicken Pox.       Objective: Physical Exam General: The patient is alert and oriented x3 in no acute distress.  Dermatology: Skin is warm, dry and supple bilateral lower extremities. Negative for open lesions or macerations bilateral.   Vascular: Dorsalis Pedis and Posterior Tibial pulses palpable bilateral.  Capillary fill time is immediate to all digits.  Neurological: Epicritic and protective threshold intact bilateral.   Musculoskeletal: Tenderness to palpation to the plantar aspect of the right heel along the plantar fascia. All other joints range of motion within normal limits bilateral. Strength 5/5 in all groups bilateral.   Radiographic exam: Normal osseous mineralization. Joint spaces preserved. No fracture/dislocation/boney destruction. No other soft tissue abnormalities or radiopaque foreign bodies.   Assessment: 1. Plantar fasciitis right  Plan of Care:  1. Patient evaluated. Xrays reviewed.   2. Injection of 0.5cc Celestone soluspan injected into the right plantar fascia  3. Rx for Medrol Dose Pack placed 4. Rx for Meloxicam ordered for patient. 5. Plantar fascial band(s) dispensed 6. Instructed patient regarding therapies and modalities at home to alleviate symptoms.  7. Return to clinic in 4 weeks.    Goes by DJ. Works for [employer].    Edrick Kins, DPM [facility]  Dr. Edrick Kins, DPM    [address], [place] [address]                Office [phone]  Fax [phone]

## 2020-01-20 ENCOUNTER — Other Ambulatory Visit: Payer: Self-pay | Admitting: Family Medicine

## 2020-01-20 NOTE — Telephone Encounter (Signed)
Requested Prescriptions  Pending Prescriptions Disp Refills  . ACETAMINOPHEN EXTRA STRENGTH 500 MG tablet [Pharmacy Med Name: ACETAMINOPHEN 500 MG TABLET] 200 tablet 2    Sig: TAKE 1 TABLET BY MOUTH EVERY 6 HOURS AS NEEDED     Over the Counter:  OTC Passed - 01/20/2020 12:14 PM      Passed - Valid encounter within last 12 months    Recent Outpatient Visits          9 months ago Erectile dysfunction, unspecified erectile dysfunction type   [facility] Birdie Sons, MD   1 year ago Strep pharyngitis   [facility] Birdie Sons, MD   2 years ago Ganglion cyst of left foot   [facility] Birdie Sons, MD   3 years ago Annual physical exam   [facility] Birdie Sons, MD   3 years ago Viral conjunctivitis of left eye   [facility] Birdie Sons, MD

## 2020-01-26 ENCOUNTER — Ambulatory Visit: Payer: Self-pay | Attending: Internal Medicine

## 2020-02-08 ENCOUNTER — Ambulatory Visit: Payer: BC Managed Care – PPO | Admitting: Podiatry

## 2020-02-22 ENCOUNTER — Encounter: Payer: PRIVATE HEALTH INSURANCE | Admitting: Family Medicine

## 2020-02-26 ENCOUNTER — Other Ambulatory Visit: Payer: Self-pay

## 2020-02-26 ENCOUNTER — Ambulatory Visit: Payer: BC Managed Care – PPO | Admitting: Podiatry

## 2020-02-26 DIAGNOSIS — M722 Plantar fascial fibromatosis: Secondary | ICD-10-CM

## 2020-03-03 NOTE — Progress Notes (Signed)
   Subjective: 44 y.o. male presenting today for follow up evaluation of plantar fasciitis of the right foot. He states his pain is relatively unchanged. He has been using the plantar fascial brace with some improvement. He also reports some relief after receiving the injections and taking the Meloxicam. Being on the foot increases the pain. Patient is here for further evaluation and treatment.   Past Medical History:  Diagnosis Date  . Diabetes mellitus without complication ([place])   . History of chicken pox 07/29/2015   DID have Chicken Pox.       Objective: Physical Exam General: The patient is alert and oriented x3 in no acute distress.  Dermatology: Skin is warm, dry and supple bilateral lower extremities. Negative for open lesions or macerations bilateral.   Vascular: Dorsalis Pedis and Posterior Tibial pulses palpable bilateral.  Capillary fill time is immediate to all digits.  Neurological: Epicritic and protective threshold intact bilateral.   Musculoskeletal: Tenderness to palpation to the plantar aspect of the right heel along the plantar fascia. All other joints range of motion within normal limits bilateral. Strength 5/5 in all groups bilateral.   Assessment: 1. Plantar fasciitis right  Plan of Care:  1. Patient evaluated.    2. Injection of 0.5cc Celestone soluspan injected into the right plantar fascia  3. Patient did not take Medrol Dose Pak. Recommended taking Medrol, then resume taking Meloxicam.  4. OTC insoles provided.  5. Continue using plantar fascial brace.  6. Return to clinic in 4 weeks.     Goes by DJ. Works for [employer].    Edrick Kins, DPM [facility]  Dr. Edrick Kins, DPM    [address], [place] [address]                Office [phone]  Fax [phone]

## 2020-03-15 ENCOUNTER — Other Ambulatory Visit: Payer: Self-pay

## 2020-03-15 ENCOUNTER — Ambulatory Visit: Payer: Self-pay | Attending: Internal Medicine

## 2020-03-15 DIAGNOSIS — Z23 Encounter for immunization: Secondary | ICD-10-CM

## 2020-03-15 NOTE — Progress Notes (Signed)
° °  [id number] Vaccination Clinic  Name:  Jonathan Berg.    MRN: [id number] DOB: [birth year]  03/15/2020  Mr. Vetrano was observed post Covid-19 immunization for 15 minutes without incident. He was provided with Vaccine Information Sheet and instruction to access the V-Safe system.   Mr. Handrich was instructed to call 911 with any severe reactions post vaccine:  Difficulty breathing   Swelling of face and throat   A fast heartbeat   A bad rash all over body   Dizziness and weakness   Immunizations Administered    Name Date Dose VIS Date Route   Pfizer COVID-19 Vaccine 03/15/2020 11:19 AM 0.3 mL 12/19/2018 Intramuscular   Manufacturer: [place]   Lot: [id number]   [place]: [id number]

## 2020-03-17 ENCOUNTER — Other Ambulatory Visit: Payer: Self-pay | Admitting: Podiatry

## 2020-03-25 ENCOUNTER — Encounter: Payer: BC Managed Care – PPO | Admitting: Podiatry

## 2020-03-28 ENCOUNTER — Ambulatory Visit: Payer: BC Managed Care – PPO | Admitting: Podiatry

## 2020-03-28 ENCOUNTER — Encounter: Payer: Self-pay | Admitting: Podiatry

## 2020-03-28 ENCOUNTER — Other Ambulatory Visit: Payer: Self-pay

## 2020-03-28 DIAGNOSIS — M722 Plantar fascial fibromatosis: Secondary | ICD-10-CM

## 2020-03-28 MED ORDER — MELOXICAM 15 MG PO TABS: 15.0000 mg | ORAL_TABLET | Freq: Every day | ORAL | 1 refills | Status: AC

## 2020-03-30 NOTE — Progress Notes (Signed)
   Subjective: 44 y.o. male presenting today for follow up evaluation of plantar fasciitis of the right foot.  Patient states he is doing much better.  He is still taking the meloxicam as directed.  He states that the injection last visit seem to help significantly.  Insoles are also helping significantly.  Past Medical History:  Diagnosis Date  . Diabetes mellitus without complication ([place])   . History of chicken pox 07/29/2015   DID have Chicken Pox.       Objective: Physical Exam General: The patient is alert and oriented x3 in no acute distress.  Dermatology: Skin is warm, dry and supple bilateral lower extremities. Negative for open lesions or macerations bilateral.   Vascular: Dorsalis Pedis and Posterior Tibial pulses palpable bilateral.  Capillary fill time is immediate to all digits.  Neurological: Epicritic and protective threshold intact bilateral.   Musculoskeletal: Minimal tenderness to palpation to the plantar aspect of the right heel along the plantar fascia. All other joints range of motion within normal limits bilateral. Strength 5/5 in all groups bilateral.   Assessment: 1. Plantar fasciitis right  Plan of Care:  1. Patient evaluated.    2.  Continue OTC insoles  5. Continue using plantar fascial brace.  6.  Continue meloxicam daily as needed.  Refill provided  7.  Return to clinic as needed.     Goes by DJ. Works for [employer].    Edrick Kins, DPM [facility]  Dr. Edrick Kins, DPM    [address], [place] [address]                Office [phone]  Fax [phone]

## 2020-04-05 ENCOUNTER — Ambulatory Visit: Payer: Self-pay | Attending: Internal Medicine

## 2020-04-05 DIAGNOSIS — Z23 Encounter for immunization: Secondary | ICD-10-CM

## 2020-04-05 NOTE — Progress Notes (Signed)
   [id number] Vaccination Clinic  Name:  Jonathan Berg.    MRN: [id number] DOB: 09/12/1976  04/05/2020  [name] was observed post Covid-19 immunization for 15 minutes without incident. He was provided with Vaccine Information Sheet and instruction to access the V-Safe system.   [name] was instructed to call 911 with any severe reactions post vaccine: Marland Kitchen Difficulty breathing  . Swelling of face and throat  . A fast heartbeat  . A bad rash all over body  . Dizziness and weakness   Immunizations Administered    Name Date Dose VIS Date Route   Pfizer COVID-19 Vaccine 04/05/2020 11:46 AM 0.3 mL 12/19/2018 Intramuscular   Manufacturer: [place]   Lot: [id number]   [place]: [id number]

## 2020-04-15 ENCOUNTER — Encounter: Payer: PRIVATE HEALTH INSURANCE | Admitting: Family Medicine

## 2020-04-23 NOTE — Progress Notes (Deleted)
Complete physical exam   Patient: Jonathan Berg.   DOB: April 06, 1976   44 y.o. Male  MRN: [id number] Visit Date: 04/25/2020  Today's healthcare provider: Lelon Huh, MD   No chief complaint on file.  Subjective    Jonathan Savage. is a 44 y.o. male who presents today for a complete physical exam.  He reports consuming a {diet types:17450} diet. {Exercise:19826} He generally feels {well/fairly well/poorly:18703}. He reports sleeping {well/fairly well/poorly:18703}. He {does/does not:200015} have additional problems to discuss today.  HPI  Follow up for Erectile Dysfunction:  The patient was last seen for this 1 years ago. Changes made at last visit include started Cialis 20 mg  He reports {excellent/good/fair/poor:19665} compliance with treatment. He feels that condition is {improved/worse/unchanged:3041574}. He {is/is not:21021397} having side effects. ***  -----------------------------------------------------------------------------------------  Follow up for Hyperglycemia:   The patient was last seen for this 1 years ago. Changes made at last visit include no change.  He reports {excellent/good/fair/poor:19665} compliance with treatment. He feels that condition is {improved/worse/unchanged:3041574}. He {is/is not:21021397} having side effects. ***  -----------------------------------------------------------------------------------------    Past Medical History:  Diagnosis Date  . Diabetes mellitus without complication ([place])   . History of chicken pox 07/29/2015   DID have Chicken Pox.     Past Surgical History:  Procedure Laterality Date  . HERNIA REPAIR     Inguinal; removal as a child  . History of removal of Cyst  2010   on back  . History of wisdom tooth extraction     Social History   Socioeconomic History  . Marital status: Married    Spouse name: Not on file  . Number of children: 3  . Years of education: Not on file  . Highest education  level: Not on file  Occupational History  . Occupation: Merchant navy officer  Tobacco Use  . Smoking status: Never Smoker  . Smokeless tobacco: Never Used  Substance and Sexual Activity  . Alcohol use: No  . Drug use: No    Comment: History of marijuana 10 yrs ago  . Sexual activity: Not on file  Other Topics Concern  . Not on file  Social History Narrative  . Not on file   Social Determinants of Health   Financial Resource Strain:   . Difficulty of Paying Living Expenses:   Food Insecurity:   . Worried About Charity fundraiser in the Last Year:   . Arboriculturist in the Last Year:   Transportation Needs:   . Film/video editor (Medical):   Marland Kitchen Lack of Transportation (Non-Medical):   Physical Activity:   . Days of Exercise per Week:   . Minutes of Exercise per Session:   Stress:   . Feeling of Stress :   Social Connections:   . Frequency of Communication with Friends and Family:   . Frequency of Social Gatherings with Friends and Family:   . Attends Religious Services:   . Active Member of Clubs or Organizations:   . Attends Archivist Meetings:   Marland Kitchen Marital Status:   Intimate Partner Violence:   . Fear of Current or Ex-Partner:   . Emotionally Abused:   Marland Kitchen Physically Abused:   . Sexually Abused:    Family Status  Relation Name Status  . Mother  Alive       gastric ulcer  . Father  Alive  . Daughter  Alive  . [employer]  . Daughter  Alive   Family History  Problem Relation Age of Onset  . Hypertension Mother   . Hypertension Father   . Diabetes Maternal Aunt    Allergies  Allergen Reactions  . Aspirin Swelling    facial  . Ibuprofen     Patient Care Team: Birdie Sons, MD as PCP - General (Family Medicine)   Medications: Outpatient Medications Prior to Visit  Medication Sig  . ACETAMINOPHEN EXTRA STRENGTH 500 MG tablet TAKE 1 TABLET BY MOUTH EVERY 6 HOURS AS NEEDED  . diclofenac (VOLTAREN) 75 MG EC tablet TAKE 1 TABLET BY MOUTH TWICE  A DAY  . famotidine (PEPCID) 20 MG tablet TAKE 1 TABLET BY MOUTH TWICE A DAY  . HIBICLENS 4 % external liquid Apply topically daily as needed.  . meloxicam (MOBIC) 15 MG tablet Take 1 tablet (15 mg total) by mouth daily.  . methylPREDNISolone (MEDROL DOSEPAK) 4 MG TBPK tablet 6 day dose pack - take as directed  . tadalafil (CIALIS) 20 MG tablet Take 1 tablet (20 mg total) by mouth every other day as needed for erectile dysfunction.   Facility-Administered Medications Prior to Visit  Medication Dose Route Frequency Provider  . betamethasone acetate-betamethasone sodium phosphate (CELESTONE) injection 3 mg  3 mg Intramuscular Once Edrick Kins, DPM    Review of Systems  Constitutional: Negative for appetite change, chills and fever.  Respiratory: Negative for chest tightness, shortness of breath and wheezing.   Cardiovascular: Negative for chest pain and palpitations.  Gastrointestinal: Negative for abdominal pain, nausea and vomiting.    {Heme  Chem  Endocrine  Serology  Results Review (optional):23779::" "}  Objective    There were no vitals taken for this visit. {Show previous vital signs (optional):23777::" "}  Physical Exam  ***  Last depression screening scores PHQ 2/9 Scores 03/29/2018 09/24/2016 07/30/2015  PHQ - 2 Score 0 0 0  PHQ- 9 Score - 0 2   Last fall risk screening No flowsheet data found. Last Audit-C alcohol use screening No flowsheet data found. A score of 3 or more in women, and 4 or more in men indicates increased risk for alcohol abuse, EXCEPT if all of the points are from question 1   No results found for any visits on 04/25/20.  Assessment & Plan    Routine Health Maintenance and Physical Exam  Exercise Activities and Dietary recommendations Goals   None     Immunization History  Administered Date(s) Administered  . PFIZER SARS-COV-2 Vaccination 03/15/2020, 04/05/2020  . Tdap 11/19/2009    Health Maintenance  Topic Date Due  .  Hepatitis C Screening  Never done  . PNEUMOCOCCAL POLYSACCHARIDE VACCINE AGE 25-64 HIGH RISK  Never done  . FOOT EXAM  Never done  . OPHTHALMOLOGY EXAM  Never done  . HIV Screening  Never done  . URINE MICROALBUMIN  07/29/2016  . HEMOGLOBIN A1C  03/10/2017  . TETANUS/TDAP  11/20/2019  . INFLUENZA VACCINE  05/25/2020  . COVID-19 Vaccine  Completed    Discussed health benefits of physical activity, and encouraged him to engage in regular exercise appropriate for his age and condition.  ***  No follow-ups on file.     {provider attestation***:1}   Lelon Huh, MD  [facility] [phone] (phone) [phone] (fax)  [place]

## 2020-04-25 ENCOUNTER — Encounter: Payer: PRIVATE HEALTH INSURANCE | Admitting: Family Medicine

## 2020-06-29 ENCOUNTER — Other Ambulatory Visit: Payer: Self-pay | Admitting: Family Medicine

## 2020-06-29 NOTE — Telephone Encounter (Signed)
Requested Prescriptions  Pending Prescriptions Disp Refills   famotidine (PEPCID) 20 MG tablet [Pharmacy Med Name: FAMOTIDINE 20 MG TABLET] 180 tablet 0    Sig: TAKE 1 TABLET BY MOUTH TWICE A DAY     Gastroenterology:  H2 Antagonists Failed - 06/29/2020  6:01 PM      Failed - Valid encounter within last 12 months    Recent Outpatient Visits          1 year ago Erectile dysfunction, unspecified erectile dysfunction type   [facility] Birdie Sons, MD   2 years ago Strep pharyngitis   [facility] Birdie Sons, MD   3 years ago Ganglion cyst of left foot   [facility] Birdie Sons, MD   3 years ago Annual physical exam   [facility] Birdie Sons, MD   3 years ago Viral conjunctivitis of left eye   [facility] Birdie Sons, MD      Future Appointments            In 1 month Fisher, Kirstie Peri, MD [facility] [place]

## 2020-07-30 ENCOUNTER — Ambulatory Visit (INDEPENDENT_AMBULATORY_CARE_PROVIDER_SITE_OTHER): Payer: BC Managed Care – PPO | Admitting: Family Medicine

## 2020-07-30 ENCOUNTER — Encounter: Payer: Self-pay | Admitting: Family Medicine

## 2020-07-30 ENCOUNTER — Other Ambulatory Visit: Payer: Self-pay

## 2020-07-30 VITALS — BP 164/105 | HR 68 | Temp 98.4°F | Ht 69.0 in | Wt 206.0 lb

## 2020-07-30 DIAGNOSIS — Z8349 Family history of other endocrine, nutritional and metabolic diseases: Secondary | ICD-10-CM

## 2020-07-30 DIAGNOSIS — I10 Essential (primary) hypertension: Secondary | ICD-10-CM | POA: Diagnosis not present

## 2020-07-30 DIAGNOSIS — Z23 Encounter for immunization: Secondary | ICD-10-CM

## 2020-07-30 DIAGNOSIS — Z8042 Family history of malignant neoplasm of prostate: Secondary | ICD-10-CM | POA: Diagnosis not present

## 2020-07-30 DIAGNOSIS — Z0000 Encounter for general adult medical examination without abnormal findings: Secondary | ICD-10-CM

## 2020-07-30 DIAGNOSIS — I159 Secondary hypertension, unspecified: Secondary | ICD-10-CM | POA: Diagnosis not present

## 2020-07-30 NOTE — Patient Instructions (Addendum)
. Please review the attached list of medications and notify my office if there are any errors.   I recommend that you get a flu vaccine this year. Please call our office at [phone] at your earliest convenience to schedule a flu shot.    DASH Eating Plan DASH stands for "Dietary Approaches to Stop Hypertension." The DASH eating plan is a healthy eating plan that has been shown to reduce high blood pressure (hypertension). It may also reduce your risk for type 2 diabetes, heart disease, and stroke. The DASH eating plan may also help with weight loss. What are tips for following this plan?  General guidelines  Avoid eating more than 2,300 mg (milligrams) of salt (sodium) a day. If you have hypertension, you may need to reduce your sodium intake to 1,500 mg a day.  Limit alcohol intake to no more than 1 drink a day for nonpregnant women and 2 drinks a day for men. One drink equals 12 oz of beer, 5 oz of wine, or 1 oz of hard liquor.  Work with your health care provider to maintain a healthy body weight or to lose weight. Ask what an ideal weight is for you.  Get at least 30 minutes of exercise that causes your heart to beat faster (aerobic exercise) most days of the week. Activities may include walking, swimming, or biking.  Work with your health care provider or diet and nutrition specialist (dietitian) to adjust your eating plan to your individual calorie needs. Reading food labels   Check food labels for the amount of sodium per serving. Choose foods with less than 5 percent of the Daily Value of sodium. Generally, foods with less than 300 mg of sodium per serving fit into this eating plan.  To find whole grains, look for the word "whole" as the first word in the ingredient list. Shopping  Buy products labeled as "low-sodium" or "no salt added."  Buy fresh foods. Avoid canned foods and premade or frozen meals. Cooking  Avoid adding salt when cooking. Use salt-free seasonings or  herbs instead of table salt or sea salt. Check with your health care provider or pharmacist before using salt substitutes.  Do not fry foods. Cook foods using healthy methods such as baking, boiling, grilling, and broiling instead.  Cook with heart-healthy oils, such as olive, canola, soybean, or sunflower oil. Meal planning  Eat a balanced diet that includes: ? 5 or more servings of fruits and vegetables each day. At each meal, try to fill half of your plate with fruits and vegetables. ? Up to 6-8 servings of whole grains each day. ? Less than 6 oz of lean meat, poultry, or fish each day. A 3-oz serving of meat is about the same size as a deck of cards. One egg equals 1 oz. ? 2 servings of low-fat dairy each day. ? A serving of nuts, seeds, or beans 5 times each week. ? Heart-healthy fats. Healthy fats called Omega-3 fatty acids are found in foods such as flaxseeds and coldwater fish, like sardines, salmon, and mackerel.  Limit how much you eat of the following: ? Canned or prepackaged foods. ? Food that is high in trans fat, such as fried foods. ? Food that is high in saturated fat, such as fatty meat. ? Sweets, desserts, sugary drinks, and other foods with added sugar. ? Full-fat dairy products.  Do not salt foods before eating.  Try to eat at least 2 vegetarian meals each week.  Eat  more home-cooked food and less restaurant, buffet, and fast food.  When eating at a restaurant, ask that your food be prepared with less salt or no salt, if possible. What foods are recommended? The items listed may not be a complete list. Talk with your dietitian about what dietary choices are best for you. Grains Whole-grain or whole-wheat bread. Whole-grain or whole-wheat pasta. Brown rice. [place]. Bulgur. Whole-grain and low-sodium cereals. Pita bread. Low-fat, low-sodium crackers. Whole-wheat flour tortillas. Vegetables Fresh or frozen vegetables (raw, steamed, roasted, or grilled).  Low-sodium or reduced-sodium tomato and vegetable juice. Low-sodium or reduced-sodium tomato sauce and tomato paste. Low-sodium or reduced-sodium canned vegetables. Fruits All fresh, dried, or frozen fruit. Canned fruit in natural juice (without added sugar). Meat and other protein foods Skinless chicken or [place]. Ground chicken or [place]. Pork with fat trimmed off. Fish and seafood. Egg whites. Dried beans, peas, or lentils. Unsalted nuts, nut butters, and seeds. Unsalted canned beans. Lean cuts of beef with fat trimmed off. Low-sodium, lean deli meat. Dairy Low-fat (1%) or fat-free (skim) milk. Fat-free, low-fat, or reduced-fat cheeses. Nonfat, low-sodium ricotta or cottage cheese. Low-fat or nonfat yogurt. Low-fat, low-sodium cheese. Fats and oils Soft margarine without trans fats. Vegetable oil. Low-fat, reduced-fat, or light mayonnaise and salad dressings (reduced-sodium). Canola, safflower, olive, soybean, and sunflower oils. Avocado. Seasoning and other foods Herbs. Spices. Seasoning mixes without salt. Unsalted popcorn and pretzels. Fat-free sweets. What foods are not recommended? The items listed may not be a complete list. Talk with your dietitian about what dietary choices are best for you. Grains Baked goods made with fat, such as croissants, muffins, or some breads. Dry pasta or rice meal packs. Vegetables Creamed or fried vegetables. Vegetables in a cheese sauce. Regular canned vegetables (not low-sodium or reduced-sodium). Regular canned tomato sauce and paste (not low-sodium or reduced-sodium). Regular tomato and vegetable juice (not low-sodium or reduced-sodium). Angie Fava. Olives. Fruits Canned fruit in a light or heavy syrup. Fried fruit. Fruit in cream or butter sauce. Meat and other protein foods Fatty cuts of meat. Ribs. Fried meat. Berniece Salines. Sausage. Bologna and other processed lunch meats. Salami. Fatback. Hotdogs. Bratwurst. Salted nuts and seeds. Canned beans with added  salt. Canned or smoked fish. Whole eggs or egg yolks. Chicken or [place] with skin. Dairy Whole or 2% milk, cream, and half-and-half. Whole or full-fat cream cheese. Whole-fat or sweetened yogurt. Full-fat cheese. Nondairy creamers. Whipped toppings. Processed cheese and cheese spreads. Fats and oils Butter. Stick margarine. Lard. Shortening. Ghee. Bacon fat. Tropical oils, such as coconut, palm kernel, or palm oil. Seasoning and other foods Salted popcorn and pretzels. Onion salt, garlic salt, seasoned salt, table salt, and sea salt. Worcestershire sauce. Tartar sauce. Barbecue sauce. Teriyaki sauce. Soy sauce, including reduced-sodium. Steak sauce. Canned and packaged gravies. Fish sauce. Oyster sauce. Cocktail sauce. Horseradish that you find on the shelf. Ketchup. Mustard. Meat flavorings and tenderizers. Bouillon cubes. Hot sauce and Tabasco sauce. Premade or packaged marinades. Premade or packaged taco seasonings. Relishes. Regular salad dressings. Where to find more information:  National Heart, Lung, and [place]: https://wilson-eaton.com/  American Heart Association: www.heart.org Summary  The DASH eating plan is a healthy eating plan that has been shown to reduce high blood pressure (hypertension). It may also reduce your risk for type 2 diabetes, heart disease, and stroke.  With the DASH eating plan, you should limit salt (sodium) intake to 2,300 mg a day. If you have hypertension, you may need to reduce your sodium intake to  1,500 mg a day.  When on the DASH eating plan, aim to eat more fresh fruits and vegetables, whole grains, lean proteins, low-fat dairy, and heart-healthy fats.  Work with your health care provider or diet and nutrition specialist (dietitian) to adjust your eating plan to your individual calorie needs. This information is not intended to replace advice given to you by your health care provider. Make sure you discuss any questions you have with your health care  provider. Document Revised: 09/23/2017 Document Reviewed: 10/04/2016 Elsevier Patient Education  2020 [employer].

## 2020-07-30 NOTE — Progress Notes (Signed)
Complete physical exam   Patient: Jonathan Berg.   DOB: 1976-02-22   44 y.o. Male  MRN: [id number] Visit Date: 07/30/2020  Today's healthcare provider: Lelon Huh, MD   Chief Complaint  Patient presents with  . Annual Exam   Subjective    Jonathan Berg. is a 44 y.o. male who presents today for a complete physical exam.  He reports consuming a general diet. The patient has a physically strenuous job, but has no regular exercise apart from work.  He generally feels fairly well. He reports sleeping fairly well. He does have additional problems to discuss today.  HPI   Pt states his BP has been elevated at home, usual Pt would like to be tested for Hemochromatosis. His father has it and is followed by Dr. Grayland Ormond.   Follow up for ED:  The patient was last seen for this 04/13/2019. Changes made at last visit include trying Cialis 20mg  daily.   -----------------------------------------------------------------------------------------   Past Medical History:  Diagnosis Date  . Diabetes mellitus without complication ([place])   . History of chicken pox 07/29/2015   DID have Chicken Pox.     Past Surgical History:  Procedure Laterality Date  . HERNIA REPAIR     Inguinal; removal as a child  . History of removal of Cyst  2010   on back  . History of wisdom tooth extraction     Social History   Socioeconomic History  . Marital status: Married    Spouse name: Not on file  . Number of children: 3  . Years of education: Not on file  . Highest education level: Not on file  Occupational History  . Occupation: Merchant navy officer  Tobacco Use  . Smoking status: Never Smoker  . Smokeless tobacco: Never Used  Vaping Use  . Vaping Use: Never used  Substance and Sexual Activity  . Alcohol use: No  . Drug use: No    Comment: History of marijuana 10 yrs ago  . Sexual activity: Not on file  Other Topics Concern  . Not on file  Social History Narrative  . Not on file    Social Determinants of Health   Financial Resource Strain:   . Difficulty of Paying Living Expenses: Not on file  Food Insecurity:   . Worried About Charity fundraiser in the Last Year: Not on file  . Ran Out of Food in the Last Year: Not on file  Transportation Needs:   . Lack of Transportation (Medical): Not on file  . Lack of Transportation (Non-Medical): Not on file  Physical Activity:   . Days of Exercise per Week: Not on file  . Minutes of Exercise per Session: Not on file  Stress:   . Feeling of Stress : Not on file  Social Connections:   . Frequency of Communication with Friends and Family: Not on file  . Frequency of Social Gatherings with Friends and Family: Not on file  . Attends Religious Services: Not on file  . Active Member of Clubs or Organizations: Not on file  . Attends Archivist Meetings: Not on file  . Marital Status: Not on file  Intimate Partner Violence:   . Fear of Current or Ex-Partner: Not on file  . Emotionally Abused: Not on file  . Physically Abused: Not on file  . Sexually Abused: Not on file   Family Status  Relation Name Status  . Mother  Alive  gastric ulcer  . Father  Alive  . Daughter  Alive  . [employer]  . Daughter  Alive  . MGF  Deceased  . PGM  Deceased  . PGF  Deceased  . Neg Hx  (Not Specified)   Family History  Problem Relation Age of Onset  . Hypertension Mother   . Hypertension Father   . Hemochromatosis Father   . Diabetes Maternal Aunt   . Cancer Maternal Grandfather   . Hemochromatosis Paternal Grandmother   . Prostate cancer Paternal Grandfather   . Colon cancer Neg Hx    Allergies  Allergen Reactions  . Aspirin Swelling    facial  . Ibuprofen     Patient Care Team: Birdie Sons, MD as PCP - General (Family Medicine)   Medications: Outpatient Medications Prior to Visit  Medication Sig  . ACETAMINOPHEN EXTRA STRENGTH 500 MG tablet TAKE 1 TABLET BY MOUTH EVERY 6 HOURS AS NEEDED   . diclofenac (VOLTAREN) 75 MG EC tablet TAKE 1 TABLET BY MOUTH TWICE A DAY  . famotidine (PEPCID) 20 MG tablet TAKE 1 TABLET BY MOUTH TWICE A DAY  . HIBICLENS 4 % external liquid Apply topically daily as needed.  . meloxicam (MOBIC) 15 MG tablet Take 1 tablet (15 mg total) by mouth daily.  . tadalafil (CIALIS) 20 MG tablet Take 1 tablet (20 mg total) by mouth every other day as needed for erectile dysfunction.  . methylPREDNISolone (MEDROL DOSEPAK) 4 MG TBPK tablet 6 day dose pack - take as directed   Facility-Administered Medications Prior to Visit  Medication Dose Route Frequency Provider  . betamethasone acetate-betamethasone sodium phosphate (CELESTONE) injection 3 mg  3 mg Intramuscular Once Edrick Kins, DPM    Review of Systems  Constitutional: Negative.  Negative for appetite change, chills, fatigue and fever.  HENT: Negative for congestion, ear pain, hearing loss, nosebleeds and trouble swallowing.   Eyes: Negative.  Negative for pain and visual disturbance.  Respiratory: Negative.  Negative for cough, chest tightness and shortness of breath.   Cardiovascular: Negative.  Negative for chest pain, palpitations and leg swelling.  Gastrointestinal: Negative.  Negative for abdominal pain, blood in stool, constipation, diarrhea, nausea and vomiting.  Endocrine: Negative.  Negative for polydipsia, polyphagia and polyuria.  Genitourinary: Negative for dysuria and flank pain.  Musculoskeletal: Positive for back pain. Negative for arthralgias, joint swelling, myalgias and neck stiffness.  Skin: Negative for color change, rash and wound.  Allergic/Immunologic: Negative.   Neurological: Negative.  Negative for dizziness, tremors, seizures, speech difficulty, weakness, light-headedness and headaches.  Hematological: Negative.   Psychiatric/Behavioral: Negative.  Negative for behavioral problems, confusion, decreased concentration, dysphoric mood and sleep disturbance. The patient is not  nervous/anxious.   All other systems reviewed and are negative.     Objective    BP (!) 164/105 (BP Location: Right Arm, Patient Position: Sitting, Cuff Size: Large)   Pulse 68   Temp 98.4 F (36.9 C) (Oral)   Ht 5\' 9"  (1.753 m)   Wt 206 lb (93.4 kg)   BMI 30.42 kg/m    Physical Exam   General Appearance:    Mildly obese male. Alert, cooperative, in no acute distress, appears stated age  Head:    Normocephalic, without obvious abnormality, atraumatic  Eyes:    PERRL, conjunctiva/corneas clear, EOM's intact, fundi    benign, both eyes       Ears:    Normal TM's. Excessive cerumen both ear canals.   Neck:  Supple, symmetrical, trachea midline, no adenopathy;       thyroid:  No enlargement/tenderness/nodules; no carotid   bruit or JVD  Back:     Symmetric, no curvature, ROM normal, no CVA tenderness  Lungs:     Clear to auscultation bilaterally, respirations unlabored  Chest wall:    No tenderness or deformity  Heart:    Normal heart rate. Normal rhythm.  No murmurs, rubs, or gallops.  S1 and S2 normal  Abdomen:     Soft, non-tender, bowel sounds active all four quadrants,    no masses, no organomegaly  Genitalia:    deferred  Rectal:    deferred  Extremities:   All extremities are intact. No cyanosis or edema  Pulses:   2+ and symmetric all extremities  Skin:   Skin color, texture, turgor normal, no rashes or lesions  Lymph nodes:   Cervical, supraclavicular, and axillary nodes normal  Neurologic:   CNII-XII intact. Normal strength, sensation and reflexes      throughout     Last depression screening scores PHQ 2/9 Scores 07/30/2020 03/29/2018 09/24/2016  PHQ - 2 Score 0 0 0  PHQ- 9 Score 1 - 0   Last fall risk screening Fall Risk  07/30/2020  Falls in the past year? 0  Number falls in past yr: 0  Injury with Fall? 0  Risk for fall due to : No Fall Risks  Follow up Falls evaluation completed   Last Audit-C alcohol use screening Alcohol Use Disorder Test (AUDIT)  07/30/2020  1. How often do you have a drink containing alcohol? 0  2. How many drinks containing alcohol do you have on a typical day when you are drinking? 0  3. How often do you have six or more drinks on one occasion? 0  AUDIT-C Score 0  Alcohol Brief Interventions/Follow-up AUDIT Score <7 follow-up not indicated   A score of 3 or more in women, and 4 or more in men indicates increased risk for alcohol abuse, EXCEPT if all of the points are from question 1   No results found for any visits on 07/30/20.  Assessment & Plan    Routine Health Maintenance and Physical Exam  Exercise Activities and Dietary recommendations Goals   None     Immunization History  Administered Date(s) Administered  . PFIZER SARS-COV-2 Vaccination 03/15/2020, 04/05/2020  . Tdap 11/19/2009    Health Maintenance  Topic Date Due  . Hepatitis C Screening  Never done  . HIV Screening  Never done  . TETANUS/TDAP  11/20/2019  . INFLUENZA VACCINE  01/22/2021 (Originally 05/25/2020)  . COVID-19 Vaccine  Completed    Discussed health benefits of physical activity, and encouraged him to engage in regular exercise appropriate for his age and condition.  1. Annual physical exam  - CBC - Comprehensive metabolic panel - Lipid panel - TSH - EKG 12-Lead - PSA  2. Primary hypertension He states he consumes very little sodium and is very physically active. Considering family history of primary hypertension I doubt lifestyle changes will get his BP to goal. Consider starting antihypertensive medication after reviewing labs.  - Comprehensive metabolic panel - TSH - EKG 12-Lead  3. Need for tetanus, diphtheria, and acellular pertussis (Tdap) vaccine in patient of adolescent age or older  - Tdap vaccine greater than or equal to 7yo IM He states he will probably bet getting flu vaccine at work.  4. Family history of prostate cancer  - PSA  5. Family history  of hemochromatosis  - Hemochromatosis  DNA-PCR(c282y,h63d)        The entirety of the information documented in the History of Present Illness, Review of Systems and Physical Exam were personally obtained by me. Portions of this information were initially documented by the CMA and reviewed by me for thoroughness and accuracy.  Lelon Huh, MD  [facility] [phone] (phone) [phone] (fax)  [place]

## 2020-07-31 ENCOUNTER — Other Ambulatory Visit: Payer: Self-pay | Admitting: Family Medicine

## 2020-07-31 ENCOUNTER — Telehealth: Payer: Self-pay

## 2020-07-31 DIAGNOSIS — I10 Essential (primary) hypertension: Secondary | ICD-10-CM | POA: Insufficient documentation

## 2020-07-31 LAB — PSA: Prostate Specific Ag, Serum: 2.2 ng/mL (ref 0.0–4.0)

## 2020-07-31 LAB — COMPREHENSIVE METABOLIC PANEL
ALT: 24 IU/L (ref 0–44)
AST: 17 IU/L (ref 0–40)
Albumin/Globulin Ratio: 1.9 (ref 1.2–2.2)
Albumin: 4.9 g/dL (ref 4.0–5.0)
Alkaline Phosphatase: 66 IU/L (ref 44–121)
BUN/Creatinine Ratio: 16 (ref 9–20)
BUN: 15 mg/dL (ref 6–24)
Bilirubin Total: 0.4 mg/dL (ref 0.0–1.2)
CO2: 24 mmol/L (ref 20–29)
Calcium: 9.7 mg/dL (ref 8.7–10.2)
Chloride: 101 mmol/L (ref 96–106)
Creatinine, Ser: 0.94 mg/dL (ref 0.76–1.27)
GFR calc Af Amer: 113 mL/min/{1.73_m2} (ref >59)
GFR calc non Af Amer: 98 mL/min/{1.73_m2} (ref >59)
Globulin, Total: 2.6 g/dL (ref 1.5–4.5)
Glucose: 95 mg/dL (ref 65–99)
Potassium: 4.3 mmol/L (ref 3.5–5.2)
Sodium: 139 mmol/L (ref 134–144)
Total Protein: 7.5 g/dL (ref 6.0–8.5)

## 2020-07-31 LAB — CBC
Hematocrit: 44.2 % (ref 37.5–51.0)
Hemoglobin: 14.9 g/dL (ref 13.0–17.7)
MCH: 28.8 pg (ref 26.6–33.0)
MCHC: 33.7 g/dL (ref 31.5–35.7)
MCV: 85 fL (ref 79–97)
Platelets: 265 10*3/uL (ref 150–450)
RBC: 5.18 x10E6/uL (ref 4.14–5.80)
RDW: 12.4 % (ref 11.6–15.4)
WBC: 7.8 10*3/uL (ref 3.4–10.8)

## 2020-07-31 LAB — LIPID PANEL
Chol/HDL Ratio: 4 ratio (ref 0.0–5.0)
Cholesterol, Total: 202 mg/dL — ABNORMAL HIGH (ref 100–199)
HDL: 51 mg/dL (ref >39)
LDL Chol Calc (NIH): 137 mg/dL — ABNORMAL HIGH (ref 0–99)
Triglycerides: 79 mg/dL (ref 0–149)
VLDL Cholesterol Cal: 14 mg/dL (ref 5–40)

## 2020-07-31 LAB — TSH: TSH: 1.01 u[IU]/mL (ref 0.450–4.500)

## 2020-07-31 MED ORDER — AMLODIPINE BESYLATE 5 MG PO TABS: 5.0000 mg | ORAL_TABLET | Freq: Every day | ORAL | 1 refills | Status: DC

## 2020-07-31 NOTE — Telephone Encounter (Signed)
Copied from [place] [id number]. Topic: General - Other >> Jul 31, 2020  8:12 AM Leward Quan A wrote: Reason for CRM: Patient would like to get a call back from Dr Caryn Section or nurse to discuss his lab results please Ph# [phone]

## 2020-08-04 LAB — HEMOCHROMATOSIS DNA-PCR(C282Y,H63D)

## 2020-08-09 NOTE — Telephone Encounter (Signed)
What are his questions?

## 2020-08-21 ENCOUNTER — Encounter: Payer: Self-pay | Admitting: Family Medicine

## 2020-09-13 ENCOUNTER — Other Ambulatory Visit: Payer: Self-pay | Admitting: Family Medicine

## 2020-09-15 NOTE — Telephone Encounter (Signed)
Please review. Thanks!  

## 2020-09-26 ENCOUNTER — Other Ambulatory Visit: Payer: Self-pay | Admitting: Family Medicine

## 2020-10-20 NOTE — Telephone Encounter (Signed)
Encounter created in error

## 2020-11-16 ENCOUNTER — Other Ambulatory Visit: Payer: Self-pay | Admitting: Family Medicine

## 2020-11-16 DIAGNOSIS — I10 Essential (primary) hypertension: Secondary | ICD-10-CM

## 2020-11-18 ENCOUNTER — Telehealth: Payer: Self-pay | Admitting: Podiatry

## 2020-11-18 DIAGNOSIS — M722 Plantar fascial fibromatosis: Secondary | ICD-10-CM

## 2020-11-18 NOTE — Telephone Encounter (Signed)
Pt called and is needing a new plantar fasciitis brace. He wears a size 10 shoe but said he thinks he needs the medium size brace because the lg/xl was too big. Please call pt and let him know if his size is available and I did tell pt the cost is 100.00 .

## 2020-11-18 NOTE — Telephone Encounter (Signed)
I spoke with patient and informed him he could stop by our office anytime during office hours and pick up a Lg/XL plantar fascial brace.

## 2020-12-14 ENCOUNTER — Other Ambulatory Visit: Payer: Self-pay | Admitting: Family Medicine

## 2020-12-14 DIAGNOSIS — I10 Essential (primary) hypertension: Secondary | ICD-10-CM

## 2021-01-05 NOTE — Progress Notes (Signed)
Established patient visit   Patient: Jonathan Berg.   DOB: 07/23/1976   45 y.o. Male  MRN: [id number] Visit Date: 01/06/2021  Today's healthcare provider: Lelon Huh, MD   No chief complaint on file.  Subjective    HPI  Nasal congestion: Patient complains of nasal congestion for the past 3 months. Symptoms occur in the early morning and at night. He has been using Afrin nasal spray with no relief. He is using it twice a day every day. He denies any cough, shortness of breath, fever, headaches, runny nose, chills or sore throat. Has had several negative Covid tests.   He also complains of pain in left forearm for the last week. No known injuries, but does a lot of lifting with work.   Hypertension, follow-up  BP Readings from Last 3 Encounters:  07/30/20 (!) 164/105  03/29/18 140/88  02/14/17 112/74   Wt Readings from Last 3 Encounters:  07/30/20 206 lb (93.4 kg)  04/13/19 195 lb (88.5 kg)  03/29/18 212 lb (96.2 kg)     He was last seen for hypertension 5 months ago.  BP at that visit was 164/105. Management since that visit includes starting Amlodipine 5mg  daily.  He reports  Poor compliance with treatment. He never started prescription but has been working on improving diet and exercising instead.  He does not smoke.  Use of agents associated with hypertension: None.  Outside blood pressures are n/a. Symptoms: No chest pain No chest pressure  No palpitations No syncope  No dyspnea No orthopnea  No paroxysmal nocturnal dyspnea No lower extremity edema   Pertinent labs: Lab Results  Component Value Date   CHOL 202 (H) 07/30/2020   HDL 51 07/30/2020   LDLCALC 137 (H) 07/30/2020   TRIG 79 07/30/2020   CHOLHDL 4.0 07/30/2020   Lab Results  Component Value Date   NA 139 07/30/2020   K 4.3 07/30/2020   CREATININE 0.94 07/30/2020   GFRNONAA 98 07/30/2020   GFRAA 113 07/30/2020   GLUCOSE 95 07/30/2020     The 10-year ASCVD risk score Mikey Bussing DC Jr.,  et al., 2013) is: 11.3%   ---------------------------------------------------------------------------------------------------     Medications: Outpatient Medications Prior to Visit  Medication Sig  . ACETAMINOPHEN EXTRA STRENGTH 500 MG tablet TAKE 1 TABLET BY MOUTH EVERY 6 HOURS AS NEEDED  . amLODipine (NORVASC) 5 MG tablet TAKE 1 TABLET BY MOUTH EVERY DAY  . diclofenac (VOLTAREN) 75 MG EC tablet TAKE 1 TABLET BY MOUTH TWICE A DAY  . famotidine (PEPCID) 20 MG tablet TAKE 1 TABLET BY MOUTH TWICE A DAY  . HIBICLENS 4 % external liquid APPLY TO AFFECTED AREA EVERY DAY AS NEEDED  . meloxicam (MOBIC) 15 MG tablet Take 1 tablet (15 mg total) by mouth daily.  . tadalafil (CIALIS) 20 MG tablet Take 1 tablet (20 mg total) by mouth every other day as needed for erectile dysfunction.   No facility-administered medications prior to visit.    Review of Systems  HENT: Positive for sinus pressure.        Objective    BP (!) 149/79 (BP Location: Right Arm, Patient Position: Sitting, Cuff Size: Large)   Pulse 73   Temp 98 F (36.7 C) (Temporal)   Ht 5\' 8"  (1.727 m)   Wt 207 lb (93.9 kg)   SpO2 100%   BMI 31.47 kg/m     Physical Exam   General Appearance:    Overweight male, alert,  cooperative, in no acute distress  HENT:   nasal mucosa pale and congested  Eyes:    PERRL, conjunctiva/corneas clear, EOM's intact       Lungs:     Clear to auscultation bilaterally, respirations unlabored  Heart:    Normal heart rate. Normal rhythm.  No murmurs, rubs, or gallops.   MS:   Tender over pronator teres and pain with pronation against resistance.         Assessment & Plan     1. 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 He probably has some underlying allergies when he first started using it a few months ago. Will start daily nasal steroid then week Afrin nasal spray as per PI.  - fluticasone (FLONASE) 50 MCG/ACT nasal spray; Place 2 sprays into both nostrils  daily.  Dispense: 16 g; Refill: 6  2. Primary hypertension He never started amlodipine but has prescription at home. Has improved with lifestyle changes, but not to goal. Is to start taking amlodipine and follow up about 6 weeks.   3. Forearm strain, left, initial encounter Relative rest, ice at end of work day, expect 2-4 weeks to heel.   Future Appointments  Date Time Provider [place]  02/17/2021  8:20 AM Birdie Sons, MD [facility]         The entirety of the information documented in the History of Present Illness, Review of Systems and Physical Exam were personally obtained by me. Portions of this information were initially documented by the CMA and reviewed by me for thoroughness and accuracy.      Lelon Huh, MD  [facility] [phone] (phone) [phone] (fax)  [place]

## 2021-01-06 ENCOUNTER — Ambulatory Visit: Payer: BC Managed Care – PPO | Admitting: Family Medicine

## 2021-01-06 ENCOUNTER — Other Ambulatory Visit: Payer: Self-pay

## 2021-01-06 VITALS — BP 149/79 | HR 73 | Temp 98.0°F | Ht 68.0 in | Wt 207.0 lb

## 2021-01-06 DIAGNOSIS — R0981 Nasal congestion: Secondary | ICD-10-CM | POA: Diagnosis not present

## 2021-01-06 DIAGNOSIS — S56912A Strain of unspecified muscles, fascia and tendons at forearm level, left arm, initial encounter: Secondary | ICD-10-CM

## 2021-01-06 DIAGNOSIS — I10 Essential (primary) hypertension: Secondary | ICD-10-CM | POA: Diagnosis not present

## 2021-01-06 MED ORDER — FLUTICASONE PROPIONATE 50 MCG/ACT NA SUSP: 2.0000 | Freq: Every day | NASAL | 6 refills | Status: DC

## 2021-01-06 NOTE — Patient Instructions (Addendum)
.   Start taking amlodipine once a day for your blood pressure   Start using prescription fluticasone nasal spray once every day   After being on fluticasone nasal spray for a week, cut back on Afrin to take it only once every night, do not take it during the day.   After another 1-2 weeks, you should be able to stop taking the Afrin all together. If you still get congested at night you can use nasal strips such as Breathe-Right

## 2021-01-27 DIAGNOSIS — M79632 Pain in left forearm: Secondary | ICD-10-CM | POA: Diagnosis not present

## 2021-02-08 ENCOUNTER — Other Ambulatory Visit: Payer: Self-pay | Admitting: Family Medicine

## 2021-02-08 NOTE — Telephone Encounter (Signed)
Requested Prescriptions  Pending Prescriptions Disp Refills  . ACETAMINOPHEN EXTRA STRENGTH 500 MG tablet [Pharmacy Med Name: ACETAMINOPHEN 500 MG TABLET] 100 tablet 5    Sig: TAKE 1 TABLET BY MOUTH EVERY 6 HOURS AS NEEDED     Over the Counter:  OTC Passed - 02/08/2021  3:53 PM      Passed - Valid encounter within last 12 months    Recent Outpatient Visits          1 month ago Nasal congestion   [facility] Birdie Sons, MD   6 months ago Annual physical exam   [facility] Birdie Sons, MD   1 year ago Erectile dysfunction, unspecified erectile dysfunction type   [facility] Birdie Sons, MD   2 years ago Strep pharyngitis   [facility] Birdie Sons, MD   3 years ago Ganglion cyst of left foot   [facility] Birdie Sons, MD      Future Appointments            In 1 week Fisher, Kirstie Peri, MD [facility], [place]

## 2021-02-17 ENCOUNTER — Ambulatory Visit: Payer: Self-pay | Admitting: Family Medicine

## 2021-02-17 NOTE — Progress Notes (Deleted)
      Established patient visit   Patient: Jonathan Berg.   DOB: [birth year]   45 y.o. Male  MRN: [id number] Visit Date: 02/17/2021  Today's healthcare provider: Lelon Huh, MD   No chief complaint on file.  Subjective    HPI  Hypertension, follow-up  BP Readings from Last 3 Encounters:  01/06/21 (!) 149/79  07/30/20 (!) 164/105  03/29/18 140/88   Wt Readings from Last 3 Encounters:  01/06/21 207 lb (93.9 kg)  07/30/20 206 lb (93.4 kg)  04/13/19 195 lb (88.5 kg)     He was last seen for hypertension 6 weeks ago.  BP at that visit was 149/79. Management since that visit includes adding amlodipine.  He reports {excellent/good/fair/poor:19665} compliance with treatment. He {is/is not:9024} having side effects. {document side effects if present:1} He is following a {diet:21022986} diet. He {is/is not:9024} exercising. He {does/does not:200015} smoke.  Use of agents associated with hypertension: {bp agents assoc with hypertension:511::"none"}.   Outside blood pressures are {***enter patient reported home BP readings, or 'not being checked':1}. Symptoms: {Yes/No:20286} chest pain {Yes/No:20286} chest pressure  {Yes/No:20286} palpitations {Yes/No:20286} syncope  {Yes/No:20286} dyspnea {Yes/No:20286} orthopnea  {Yes/No:20286} paroxysmal nocturnal dyspnea {Yes/No:20286} lower extremity edema   Pertinent labs: Lab Results  Component Value Date   CHOL 202 (H) 07/30/2020   HDL 51 07/30/2020   LDLCALC 137 (H) 07/30/2020   TRIG 79 07/30/2020   CHOLHDL 4.0 07/30/2020   Lab Results  Component Value Date   NA 139 07/30/2020   K 4.3 07/30/2020   CREATININE 0.94 07/30/2020   GFRNONAA 98 07/30/2020   GFRAA 113 07/30/2020   GLUCOSE 95 07/30/2020     The 10-year ASCVD risk score Mikey Bussing DC Jr., et al., 2013) is: 9.5%   ---------------------------------------------------------------------------------------------------   {Show patient history (optional):23778::" "}    Medications: Outpatient Medications Prior to Visit  Medication Sig  . ACETAMINOPHEN EXTRA STRENGTH 500 MG tablet TAKE 1 TABLET BY MOUTH EVERY 6 HOURS AS NEEDED  . amLODipine (NORVASC) 5 MG tablet TAKE 1 TABLET BY MOUTH EVERY DAY  . diclofenac (VOLTAREN) 75 MG EC tablet TAKE 1 TABLET BY MOUTH TWICE A DAY  . famotidine (PEPCID) 20 MG tablet TAKE 1 TABLET BY MOUTH TWICE A DAY  . fluticasone (FLONASE) 50 MCG/ACT nasal spray Place 2 sprays into both nostrils daily.  Marland Kitchen HIBICLENS 4 % external liquid APPLY TO AFFECTED AREA EVERY DAY AS NEEDED  . meloxicam (MOBIC) 15 MG tablet Take 1 tablet (15 mg total) by mouth daily.  . tadalafil (CIALIS) 20 MG tablet Take 1 tablet (20 mg total) by mouth every other day as needed for erectile dysfunction.   No facility-administered medications prior to visit.    Review of Systems  {Labs  Heme  Chem  Endocrine  Serology  Results Review (optional):23779::" "}   Objective    There were no vitals taken for this visit. {Show previous vital signs (optional):23777::" "}   Physical Exam  ***  No results found for any visits on 02/17/21.  Assessment & Plan     ***  No follow-ups on file.      {provider attestation***:1}   Lelon Huh, MD  [facility] [phone] (phone) [phone] (fax)  [place]

## 2021-03-10 ENCOUNTER — Ambulatory Visit: Payer: Self-pay | Admitting: Family Medicine

## 2021-03-31 ENCOUNTER — Ambulatory Visit: Payer: BC Managed Care – PPO | Admitting: Family Medicine

## 2021-03-31 ENCOUNTER — Other Ambulatory Visit: Payer: Self-pay

## 2021-03-31 ENCOUNTER — Encounter: Payer: Self-pay | Admitting: Family Medicine

## 2021-03-31 DIAGNOSIS — I10 Essential (primary) hypertension: Secondary | ICD-10-CM

## 2021-03-31 NOTE — Progress Notes (Signed)
Established patient visit   Patient: Jonathan Berg.   DOB: Sep 03, 1976   45 y.o. Male  MRN: [id number] Visit Date: 03/31/2021  Today's healthcare provider: Lelon Huh, MD   Chief Complaint  Patient presents with  . Hypertension   Subjective    HPI  Hypertension, follow-up  BP Readings from Last 3 Encounters:  03/31/21 139/78  01/06/21 (!) 149/79  07/30/20 (!) 164/105   Wt Readings from Last 3 Encounters:  03/31/21 208 lb (94.3 kg)  01/06/21 207 lb (93.9 kg)  07/30/20 206 lb (93.4 kg)     He was last seen for hypertension 3 months ago.  BP at that visit was 149/79. Management since that visit includes starting amlodipine 5mg  daily.  He reports poor compliance with treatment. Pt states he is not taking amlodipine but is working on lifestyle changes. He is following a Low Sodium diet. He is exercising. He does not smoke.  Use of agents associated with hypertension: none.   Outside blood pressures are not being checked. Symptoms: No chest pain No chest pressure  No palpitations No syncope  No dyspnea No orthopnea  No paroxysmal nocturnal dyspnea No lower extremity edema   Pertinent labs: Lab Results  Component Value Date   CHOL 202 (H) 07/30/2020   HDL 51 07/30/2020   LDLCALC 137 (H) 07/30/2020   TRIG 79 07/30/2020   CHOLHDL 4.0 07/30/2020   Lab Results  Component Value Date   NA 139 07/30/2020   K 4.3 07/30/2020   CREATININE 0.94 07/30/2020   GFRNONAA 98 07/30/2020   GFRAA 113 07/30/2020   GLUCOSE 95 07/30/2020     The 10-year ASCVD risk score Mikey Bussing DC Jr., et al., 2013) is: 8.4%   ---------------------------------------------------------------------------------------------------  Allergies  Allergen Reactions  . Aspirin Swelling    facial  . Ibuprofen      Medications: Outpatient Medications Prior to Visit  Medication Sig  . ACETAMINOPHEN EXTRA STRENGTH 500 MG tablet TAKE 1 TABLET BY MOUTH EVERY 6 HOURS AS NEEDED  . diclofenac  (VOLTAREN) 75 MG EC tablet TAKE 1 TABLET BY MOUTH TWICE A DAY  . famotidine (PEPCID) 20 MG tablet TAKE 1 TABLET BY MOUTH TWICE A DAY  . fluticasone (FLONASE) 50 MCG/ACT nasal spray Place 2 sprays into both nostrils daily.  Marland Kitchen HIBICLENS 4 % external liquid APPLY TO AFFECTED AREA EVERY DAY AS NEEDED  . meloxicam (MOBIC) 15 MG tablet Take 1 tablet (15 mg total) by mouth daily.  . tadalafil (CIALIS) 20 MG tablet Take 1 tablet (20 mg total) by mouth every other day as needed for erectile dysfunction.  Marland Kitchen amLODipine (NORVASC) 5 MG tablet TAKE 1 TABLET BY MOUTH EVERY DAY (Patient not taking: Reported on 03/31/2021)   No facility-administered medications prior to visit.    Review of Systems  Constitutional: Negative.   Respiratory: Negative.   Cardiovascular: Negative.   Gastrointestinal: Negative.   Neurological: Negative for dizziness, syncope, speech difficulty, light-headedness, numbness and headaches.      Objective    BP 139/78 (BP Location: Right Arm, Patient Position: Sitting, Cuff Size: Large)   Pulse 69   Wt 208 lb (94.3 kg)   SpO2 100%   BMI 31.63 kg/m     Physical Exam  General appearance: Mildly obese male, cooperative and in no acute distress Head: Normocephalic, without obvious abnormality, atraumatic Respiratory: Respirations even and unlabored, normal respiratory rate Extremities: All extremities are intact.  Skin: Skin color, texture, turgor normal. No rashes  seen  Psych: Appropriate mood and affect. Neurologic: Mental status: Alert, oriented to person, place, and time, thought content appropriate.     Assessment & Plan     1. Primary hypertension Has not yet started amlodipine. Has improved with lifestyle changes, but not to goal. He does have amlodipine at home and will start taking it. Is to follow up to check BP in 3 months.         The entirety of the information documented in the History of Present Illness, Review of Systems and Physical Exam were personally  obtained by me. Portions of this information were initially documented by the CMA and reviewed by me for thoroughness and accuracy.      Lelon Huh, MD  [facility] [phone] (phone) [phone] (fax)  [place]

## 2021-06-15 DIAGNOSIS — L730 Acne keloid: Secondary | ICD-10-CM | POA: Diagnosis not present

## 2021-06-15 DIAGNOSIS — L02821 Furuncle of head [any part, except face]: Secondary | ICD-10-CM | POA: Diagnosis not present

## 2021-06-16 ENCOUNTER — Telehealth: Payer: Self-pay | Admitting: Family Medicine

## 2021-06-16 MED ORDER — NIRMATRELVIR/RITONAVIR (PAXLOVID)TABLET: 3.0000 | ORAL_TABLET | Freq: Two times a day (BID) | ORAL | 0 refills | Status: AC

## 2021-06-16 NOTE — Telephone Encounter (Signed)
I called and spoke with patient. He says his wife tested positive for COVID and had a telephone visit with Dr. Caryn Section today. Patient says that he took a COVID test today and the result was positive. Patient would like a prescription for an antiviral sent into the pharmacy. Patient symptoms include dry cough, chest congestion, fatigue and sluggish feeling. Symptoms started yesterday. Patient has had 2 doses of Pfizer vaccine and one Coca-[employer]a booster. Please advise.   Pharmacy [facility]

## 2021-06-16 NOTE — Telephone Encounter (Signed)
Pt called and wants to be called back today, says he was told that the office would call him today.

## 2021-06-16 NOTE — Telephone Encounter (Signed)
Pt and wife tested positive for covid / pt has a slight cough and congestion and wanted to see if he could be prescribed paxlovid / please advise   Wife has a virtual with Dr. Caryn Section today at 1pm

## 2021-06-17 NOTE — Telephone Encounter (Addendum)
Patient advised that prescription has been sent into pharmacy.

## 2021-06-18 ENCOUNTER — Telehealth: Payer: BC Managed Care – PPO | Admitting: Family Medicine

## 2021-06-18 ENCOUNTER — Other Ambulatory Visit: Payer: Self-pay | Admitting: Family Medicine

## 2021-06-18 NOTE — Telephone Encounter (Signed)
Requested Prescriptions  Pending Prescriptions Disp Refills  . fluticasone (FLONASE) 50 MCG/ACT nasal spray [Pharmacy Med Name: FLUTICASONE PROP 50 MCG SPRAY] 48 mL 2    Sig: SPRAY 2 SPRAYS INTO EACH NOSTRIL EVERY DAY     Ear, Nose, and Throat: Nasal Preparations - Corticosteroids Passed - 06/18/2021  1:26 AM      Passed - Valid encounter within last 12 months    Recent Outpatient Visits          2 months ago Primary hypertension   [facility] Birdie Sons, MD   5 months ago Nasal congestion   [facility] Birdie Sons, MD   10 months ago Annual physical exam   [facility] Birdie Sons, MD   2 years ago Erectile dysfunction, unspecified erectile dysfunction type   [facility] Birdie Sons, MD   3 years ago Strep pharyngitis   [facility] Birdie Sons, MD      Future Appointments            Today Chrismon, Vickki Muff, PA-C [facility], [employer]   In 1 week Caryn Section, Kirstie Peri, MD [facility], [place]

## 2021-07-01 ENCOUNTER — Ambulatory Visit: Payer: BC Managed Care – PPO | Admitting: Family Medicine

## 2021-07-28 ENCOUNTER — Ambulatory Visit: Payer: BC Managed Care – PPO | Admitting: Family Medicine

## 2021-08-03 ENCOUNTER — Other Ambulatory Visit: Payer: Self-pay | Admitting: Urology

## 2021-08-04 LAB — PSA: Prostate Specific Ag, Serum: 2.9 ng/mL (ref 0.0–4.0)

## 2021-08-10 ENCOUNTER — Telehealth: Payer: Self-pay

## 2021-08-10 NOTE — Telephone Encounter (Signed)
Patient informed PSA results elevated for his age, needs to recheck in 1 year, discuss with pcp if preferred. Patient verbalized understanding.

## 2021-08-18 ENCOUNTER — Telehealth: Payer: Self-pay

## 2021-08-18 NOTE — Telephone Encounter (Signed)
Pt advised.   Thanks,   -Rachel Rison  

## 2021-08-18 NOTE — Telephone Encounter (Signed)
Please review.  I think Oncology ordered the PSA.

## 2021-08-18 NOTE — Telephone Encounter (Signed)
Normal PSA for men over the age of 47 is anything under 4.0.  There's not really a normal range defined for men under the age of 61. 2.9 may be borderline high for his age. The urologist looked over the lab result and recommended he have PSA checked once a year.

## 2021-08-18 NOTE — Telephone Encounter (Signed)
Copied from [place] [id number]. Topic: General - Other >> Aug 18, 2021  9:58 AM Alanda Slim E wrote: Reason for CRM: Pt took a PSA test and was advise it was high at 2.9/ pt would like to speak with Dr. Caryn Section or nurse about this Steele Sizer stated last year it was a 2.2 please advise

## 2021-09-28 ENCOUNTER — Other Ambulatory Visit: Payer: Self-pay

## 2021-09-28 ENCOUNTER — Ambulatory Visit: Payer: BC Managed Care – PPO | Admitting: Family Medicine

## 2021-09-28 ENCOUNTER — Encounter: Payer: Self-pay | Admitting: Family Medicine

## 2021-09-28 VITALS — BP 145/83 | HR 78 | Temp 98.2°F | Wt 204.0 lb

## 2021-09-28 DIAGNOSIS — Z1211 Encounter for screening for malignant neoplasm of colon: Secondary | ICD-10-CM | POA: Diagnosis not present

## 2021-09-28 DIAGNOSIS — I10 Essential (primary) hypertension: Secondary | ICD-10-CM

## 2021-09-28 DIAGNOSIS — N529 Male erectile dysfunction, unspecified: Secondary | ICD-10-CM | POA: Diagnosis not present

## 2021-09-28 MED ORDER — TADALAFIL 20 MG PO TABS: 20.0000 mg | ORAL_TABLET | ORAL | 3 refills | Status: DC | PRN

## 2021-09-28 NOTE — Progress Notes (Signed)
Established patient visit   Patient: Jonathan Berg.   DOB: [birth year]   45 y.o. Male  MRN: [id number] Visit Date: 09/28/2021  Today's healthcare provider: Lelon Huh, MD   Chief Complaint  Patient presents with   Hypertension   Subjective    Hypertension Pertinent negatives include no headaches.   Hypertension, follow-up  BP Readings from Last 3 Encounters:  09/28/21 (!) 145/83  03/31/21 139/78  01/06/21 (!) 149/79   Wt Readings from Last 3 Encounters:  09/28/21 204 lb (92.5 kg)  03/31/21 208 lb (94.3 kg)  01/06/21 207 lb (93.9 kg)     He was last seen for hypertension 6 months ago.  BP at that visit was 139/78. Management since that visit includes continue to work on lifestyle changes.  He reports excellent compliance with treatment. He is not having side effects.  He is following a Regular, Low Sodium diet. He is exercising. He does not smoke.  Use of agents associated with hypertension: none.   Outside blood pressures are checked occasionally. Symptoms: No chest pain No chest pressure  No palpitations No syncope  No dyspnea No orthopnea  No paroxysmal nocturnal dyspnea No lower extremity edema   Pertinent labs: Lab Results  Component Value Date   CHOL 202 (H) 07/30/2020   HDL 51 07/30/2020   LDLCALC 137 (H) 07/30/2020   TRIG 79 07/30/2020   CHOLHDL 4.0 07/30/2020   Lab Results  Component Value Date   NA 139 07/30/2020   K 4.3 07/30/2020   CREATININE 0.94 07/30/2020   GFRNONAA 98 07/30/2020   GLUCOSE 95 07/30/2020   TSH 1.010 07/30/2020     The 10-year ASCVD risk score (Arnett DK, et al., 2019) is: 9.5%   ---------------------------------------------------------------------------------------------------     Medications: Outpatient Medications Prior to Visit  Medication Sig   ACETAMINOPHEN EXTRA STRENGTH 500 MG tablet TAKE 1 TABLET BY MOUTH EVERY 6 HOURS AS NEEDED   diclofenac (VOLTAREN) 75 MG EC tablet TAKE 1 TABLET BY MOUTH  TWICE A DAY   famotidine (PEPCID) 20 MG tablet TAKE 1 TABLET BY MOUTH TWICE A DAY   fluticasone (FLONASE) 50 MCG/ACT nasal spray SPRAY 2 SPRAYS INTO EACH NOSTRIL EVERY DAY   HIBICLENS 4 % external liquid APPLY TO AFFECTED AREA EVERY DAY AS NEEDED   meloxicam (MOBIC) 15 MG tablet Take 1 tablet (15 mg total) by mouth daily.   [DISCONTINUED] tadalafil (CIALIS) 20 MG tablet Take 1 tablet (20 mg total) by mouth every other day as needed for erectile dysfunction.   amLODipine (NORVASC) 5 MG tablet TAKE 1 TABLET BY MOUTH EVERY DAY (Patient not taking: Reported on 03/31/2021)   No facility-administered medications prior to visit.    Review of Systems  Constitutional: Negative.   Respiratory: Negative.    Cardiovascular: Negative.   Gastrointestinal: Negative.   Neurological:  Negative for dizziness, syncope, speech difficulty, light-headedness, numbness and headaches.      Objective    BP (!) 145/83 (BP Location: Right Arm, Patient Position: Sitting, Cuff Size: Large)   Pulse 78   Temp 98.2 F (36.8 C) (Oral)   Wt 204 lb (92.5 kg)   SpO2 100%   BMI 31.02 kg/m    Physical Exam  General appearance: Mildly obese male, cooperative and in no acute distress Head: Normocephalic, without obvious abnormality, atraumatic Respiratory: Respirations even and unlabored, normal respiratory rate Extremities: All extremities are intact.  Skin: Skin color, texture, turgor normal. No rashes seen  Psych: Appropriate  mood and affect. Neurologic: Mental status: Alert, oriented to person, place, and time, thought content appropriate.   No results found for any visits on 09/28/21.  Assessment & Plan     1. Primary hypertension He has not yet started amlodipine in hopes of controlling BP with diet and exercise. BP is still not to goal. He has full bottle of medications and agrees to start taking now.   2. Screening for colon cancer  - Ambulatory referral to Gastroenterology  3. Erectile dysfunction,  unspecified erectile dysfunction type refill tadalafil (CIALIS) 20 MG tablet; Take 1 tablet (20 mg total) by mouth every other day as needed for erectile dysfunction.  Dispense: 8 tablet; Refill: 3   He has PSA checked at health screening through work in October and was up to 2.9 from 2.2 in October of 2021. Will check PSA at follow up in about 4-5 months. .        The entirety of the information documented in the History of Present Illness, Review of Systems and Physical Exam were personally obtained by me. Portions of this information were initially documented by the CMA and reviewed by me for thoroughness and accuracy.     Lelon Huh, MD  [facility] [phone] (phone) [phone] (fax)  [place]

## 2021-10-02 ENCOUNTER — Telehealth: Payer: Self-pay

## 2021-10-02 ENCOUNTER — Other Ambulatory Visit: Payer: Self-pay

## 2021-10-02 MED ORDER — NA SULFATE-K SULFATE-MG SULF 17.5-3.13-1.6 GM/177ML PO SOLN: 1.0000 | Freq: Once | ORAL | 0 refills | Status: AC

## 2021-10-02 NOTE — Telephone Encounter (Signed)
Gastroenterology Pre-Procedure Review  Request Date: 10/21/21 Requesting Physician: Dr. Bonna Gains  PATIENT REVIEW QUESTIONS: The patient responded to the following health history questions as indicated:    1. Are you having any GI issues? no 2. Do you have a personal history of Polyps? no 3. Do you have a family history of Colon Cancer or Polyps? no 4. Diabetes Mellitus? no 5. Joint replacements in the past 12 months?no 6. Major health problems in the past 3 months?no 7. Any artificial heart valves, MVP, or defibrillator?no    MEDICATIONS & ALLERGIES:    Patient reports the following regarding taking any anticoagulation/antiplatelet therapy:   Plavix, Coumadin, Eliquis, Xarelto, Lovenox, Pradaxa, Brilinta, or Effient? no Aspirin? no  Patient confirms/reports the following medications:  Current Outpatient Medications  Medication Sig Dispense Refill   ACETAMINOPHEN EXTRA STRENGTH 500 MG tablet TAKE 1 TABLET BY MOUTH EVERY 6 HOURS AS NEEDED 100 tablet 5   amLODipine (NORVASC) 5 MG tablet TAKE 1 TABLET BY MOUTH EVERY DAY (Patient not taking: Reported on 03/31/2021) 90 tablet 1   diclofenac (VOLTAREN) 75 MG EC tablet TAKE 1 TABLET BY MOUTH TWICE A DAY 60 tablet 1   famotidine (PEPCID) 20 MG tablet TAKE 1 TABLET BY MOUTH TWICE A DAY 180 tablet 3   fluticasone (FLONASE) 50 MCG/ACT nasal spray SPRAY 2 SPRAYS INTO EACH NOSTRIL EVERY DAY 48 mL 2   HIBICLENS 4 % external liquid APPLY TO AFFECTED AREA EVERY DAY AS NEEDED 3790 mL 4   meloxicam (MOBIC) 15 MG tablet Take 1 tablet (15 mg total) by mouth daily. 30 tablet 1   tadalafil (CIALIS) 20 MG tablet Take 1 tablet (20 mg total) by mouth every other day as needed for erectile dysfunction. 8 tablet 3   No current facility-administered medications for this visit.    Patient confirms/reports the following allergies:  Allergies  Allergen Reactions   Aspirin Swelling    facial   Ibuprofen     No orders of the defined types were placed in this  encounter.   AUTHORIZATION INFORMATION Primary Insurance: 1D#: Group #:  Secondary Insurance: 1D#: Group #:  SCHEDULE INFORMATION: Date:  Time: Location:

## 2021-10-14 ENCOUNTER — Telehealth: Payer: Self-pay

## 2021-10-14 NOTE — Telephone Encounter (Signed)
Canceled procedure patient will call back when he is ready to reschedule

## 2021-10-14 NOTE — Telephone Encounter (Signed)
CALLED PATIENT NO ANSWER LEFT VOICEMAIL FOR A CALL BACK ? ?

## 2021-10-20 ENCOUNTER — Telehealth: Payer: Self-pay | Admitting: Gastroenterology

## 2021-10-20 NOTE — Telephone Encounter (Signed)
Inbound call from pt requesting a call back stating that he needs to r/s his colonoscopy. Thank you.

## 2021-10-21 ENCOUNTER — Encounter: Admission: RE | Payer: Self-pay | Source: Home / Self Care

## 2021-10-21 ENCOUNTER — Other Ambulatory Visit: Payer: Self-pay

## 2021-10-21 ENCOUNTER — Ambulatory Visit
Admission: RE | Admit: 2021-10-21 | Payer: BC Managed Care – PPO | Source: Home / Self Care | Admitting: Gastroenterology

## 2021-10-21 SURGERY — COLONOSCOPY
Anesthesia: General

## 2021-10-21 NOTE — Telephone Encounter (Signed)
Returned pt's call and rescheduled him to 12/04/21.

## 2021-10-22 ENCOUNTER — Other Ambulatory Visit: Payer: Self-pay | Admitting: Family Medicine

## 2021-10-22 ENCOUNTER — Other Ambulatory Visit: Payer: Self-pay

## 2021-10-22 DIAGNOSIS — Z1211 Encounter for screening for malignant neoplasm of colon: Secondary | ICD-10-CM

## 2021-11-24 DIAGNOSIS — L853 Xerosis cutis: Secondary | ICD-10-CM | POA: Diagnosis not present

## 2021-11-24 DIAGNOSIS — L730 Acne keloid: Secondary | ICD-10-CM | POA: Diagnosis not present

## 2021-12-01 ENCOUNTER — Telehealth: Payer: Self-pay

## 2021-12-01 NOTE — Telephone Encounter (Signed)
Patient has some questions about her colonoscopy prep

## 2021-12-02 ENCOUNTER — Telehealth: Payer: Self-pay

## 2021-12-02 MED ORDER — NA SULFATE-K SULFATE-MG SULF 17.5-3.13-1.6 GM/177ML PO SOLN: 1.0000 | Freq: Once | ORAL | 0 refills | Status: AC

## 2021-12-02 NOTE — Telephone Encounter (Signed)
Patient called and needed new prep sent in to pharmacy I resent prep for patient

## 2021-12-03 ENCOUNTER — Telehealth: Payer: Self-pay

## 2021-12-03 NOTE — Telephone Encounter (Signed)
Patient called needs to reschedule colonoscopy he is congested rescheduled for 01/22/2022 sent new referral to [facility] and called endo and sent new communications to patient

## 2021-12-04 ENCOUNTER — Ambulatory Visit
Admission: RE | Admit: 2021-12-04 | Payer: BC Managed Care – PPO | Source: Home / Self Care | Admitting: Gastroenterology

## 2021-12-04 ENCOUNTER — Encounter: Admission: RE | Payer: Self-pay | Source: Home / Self Care

## 2021-12-04 SURGERY — COLONOSCOPY WITH PROPOFOL
Anesthesia: General

## 2022-01-14 ENCOUNTER — Telehealth: Payer: Self-pay

## 2022-01-14 MED ORDER — NA SULFATE-K SULFATE-MG SULF 17.5-3.13-1.6 GM/177ML PO SOLN: 1.0000 | Freq: Once | ORAL | 0 refills | Status: AC

## 2022-01-14 NOTE — Telephone Encounter (Signed)
Patient called he wanted a sample prep I told him we r out of them now and sent him in a prescription to the pharmacy ?

## 2022-01-22 ENCOUNTER — Other Ambulatory Visit: Payer: Self-pay

## 2022-01-22 ENCOUNTER — Ambulatory Visit: Payer: BC Managed Care – PPO | Admitting: Anesthesiology

## 2022-01-22 ENCOUNTER — Encounter: Payer: Self-pay | Admitting: Gastroenterology

## 2022-01-22 ENCOUNTER — Ambulatory Visit
Admission: RE | Admit: 2022-01-22 | Discharge: 2022-01-22 | Disposition: A | Discharge status: Home / Self Care | Payer: BC Managed Care – PPO | Source: Ambulatory Visit | Attending: Gastroenterology | Admitting: Gastroenterology

## 2022-01-22 ENCOUNTER — Encounter
Admission: RE | Disposition: A | Discharge status: Home / Self Care | Payer: Self-pay | Source: Ambulatory Visit | Attending: Gastroenterology

## 2022-01-22 DIAGNOSIS — Z1211 Encounter for screening for malignant neoplasm of colon: Secondary | ICD-10-CM | POA: Insufficient documentation

## 2022-01-22 DIAGNOSIS — D127 Benign neoplasm of rectosigmoid junction: Secondary | ICD-10-CM | POA: Insufficient documentation

## 2022-01-22 DIAGNOSIS — K219 Gastro-esophageal reflux disease without esophagitis: Secondary | ICD-10-CM | POA: Insufficient documentation

## 2022-01-22 DIAGNOSIS — K640 First degree hemorrhoids: Secondary | ICD-10-CM | POA: Insufficient documentation

## 2022-01-22 DIAGNOSIS — Z79899 Other long term (current) drug therapy: Secondary | ICD-10-CM | POA: Insufficient documentation

## 2022-01-22 DIAGNOSIS — K635 Polyp of colon: Secondary | ICD-10-CM

## 2022-01-22 DIAGNOSIS — I10 Essential (primary) hypertension: Secondary | ICD-10-CM | POA: Insufficient documentation

## 2022-01-22 DIAGNOSIS — D124 Benign neoplasm of descending colon: Secondary | ICD-10-CM | POA: Insufficient documentation

## 2022-01-22 DIAGNOSIS — K5730 Diverticulosis of large intestine without perforation or abscess without bleeding: Secondary | ICD-10-CM | POA: Diagnosis not present

## 2022-01-22 DIAGNOSIS — D128 Benign neoplasm of rectum: Secondary | ICD-10-CM | POA: Diagnosis not present

## 2022-01-22 HISTORY — DX: Essential (primary) hypertension: I10

## 2022-01-22 HISTORY — PX: COLONOSCOPY WITH PROPOFOL: SHX5780

## 2022-01-22 SURGERY — COLONOSCOPY WITH PROPOFOL
Anesthesia: General

## 2022-01-22 MED ORDER — PROPOFOL 10 MG/ML IV BOLUS
INTRAVENOUS | Status: DC | PRN
  Administered 2022-01-22 (×2): 100 mg via INTRAVENOUS

## 2022-01-22 MED ORDER — PROPOFOL 500 MG/50ML IV EMUL
INTRAVENOUS | Status: DC | PRN
  Administered 2022-01-22: 200 ug/kg/min via INTRAVENOUS

## 2022-01-22 MED ORDER — STERILE WATER FOR IRRIGATION IR SOLN
Status: DC | PRN
  Administered 2022-01-22: 60 mL

## 2022-01-22 MED ORDER — SODIUM CHLORIDE 0.9 % IV SOLN: INTRAVENOUS | Status: DC

## 2022-01-22 NOTE — Anesthesia Procedure Notes (Signed)
Date/Time: 01/22/2022 1:03 PM ?Performed by: Nelda Marseille, CRNA ?Pre-anesthesia Checklist: Patient identified, Emergency Drugs available, Suction available, Patient being monitored and Timeout performed ?Oxygen Delivery Method: Nasal cannula ? ? ? ? ?

## 2022-01-22 NOTE — Anesthesia Preprocedure Evaluation (Signed)
Anesthesia Evaluation  ?Patient identified by MRN, date of birth, ID band ?Patient awake ? ? ? ?Reviewed: ?Allergy & Precautions, NPO status , Patient's Chart, lab work & pertinent test results ? ?Airway ?Mallampati: III ? ?TM Distance: >3 FB ?Neck ROM: full ? ? ? Dental ?no notable dental hx. ? ?  ?Pulmonary ?neg pulmonary ROS,  ?  ?Pulmonary exam normal ? ? ? ? ? ? ? Cardiovascular ?hypertension, Normal cardiovascular exam ? ? ?  ?Neuro/Psych ?negative neurological ROS ? negative psych ROS  ? GI/Hepatic ?Neg liver ROS, GERD  Controlled and Medicated,  ?Endo/Other  ?negative endocrine ROS ? Renal/GU ?negative Renal ROS  ?negative genitourinary ?  ?Musculoskeletal ? ? Abdominal ?Normal abdominal exam  (+)   ?Peds ? Hematology ?negative hematology ROS ?(+)   ?Anesthesia Other Findings ?Past Medical History: ?07/29/2015: History of chicken pox ?    Comment:  DID have Chicken Pox.   ?No date: Hypertension ? ?Past Surgical History: ?No date: HERNIA REPAIR ?    Comment:  Inguinal; removal as a child ?2010: History of removal of Cyst ?    Comment:  on back ?No date: History of wisdom tooth extraction ? ?BMI   ? Body Mass Index: 29.65 kg/m?  ?  ? ? Reproductive/Obstetrics ?negative OB ROS ? ?  ? ? ? ? ? ? ? ? ? ? ? ? ? ?  ?  ? ? ? ? ? ? ? ? ?Anesthesia Physical ?Anesthesia Plan ? ?ASA: 2 ? ?Anesthesia Plan: General  ? ?Post-op Pain Management:   ? ?Induction: Intravenous ? ?PONV Risk Score and Plan: Propofol infusion and TIVA ? ?Airway Management Planned: Natural Airway and Simple Face Mask ? ?Additional Equipment:  ? ?Intra-op Plan:  ? ?Post-operative Plan:  ? ?Informed Consent: I have reviewed the patients History and Physical, chart, labs and discussed the procedure including the risks, benefits and alternatives for the proposed anesthesia with the patient or authorized representative who has indicated his/her understanding and acceptance.  ? ? ? ?Dental Advisory Given ? ?Plan Discussed  with: Anesthesiologist, CRNA and Surgeon ? ?Anesthesia Plan Comments:   ? ? ? ? ? ? ?Anesthesia Quick Evaluation ? ?

## 2022-01-22 NOTE — Op Note (Signed)
[facility] ?Gastroenterology ?Patient Name: Jonathan Berg ?Procedure Date: 01/22/2022 12:52 PM ?MRN: [id number] ?Account #: [id number] ?Date of Birth: 1975-10-30 ?Admit Type: Inpatient ?Age: 46 ?Room: [facility] ENDO ROOM 3 ?Gender: Male ?Note Status: Finalized ?Instrument Name: Colonoscope [id number] ?Procedure:             Colonoscopy ?Indications:           Screening for colorectal malignant neoplasm ?Providers:             Jonathon Bellows MD, MD ?Referring MD:          Kirstie Peri. Caryn Section, MD (Referring MD) ?Medicines:             Monitored Anesthesia Care ?Complications:         No immediate complications. ?Procedure:             Pre-Anesthesia Assessment: ?                       - Prior to the procedure, a History and Physical was  ?                       performed, and patient medications, allergies and  ?                       sensitivities were reviewed. The patient's tolerance  ?                       of previous anesthesia was reviewed. ?                       - The risks and benefits of the procedure and the  ?                       sedation options and risks were discussed with the  ?                       patient. All questions were answered and informed  ?                       consent was obtained. ?                       - ASA Grade Assessment: II - A patient with mild  ?                       systemic disease. ?                       After obtaining informed consent, the colonoscope was  ?                       passed under direct vision. Throughout the procedure,  ?                       the patient's blood pressure, pulse, and oxygen  ?                       saturations were monitored continuously. The  ?                       Colonoscope was  introduced through the anus and  ?                       advanced to the the cecum, identified by the  ?                       appendiceal orifice. The colonoscopy was performed  ?                       with ease. The patient tolerated the procedure well.  ?                        The quality of the bowel preparation was adequate. ?Findings: ?     The perianal and digital rectal examinations were normal. ?     Three sessile polyps were found in the descending colon. The polyps were  ?     4 to 7 mm in size. These polyps were removed with a cold snare.  ?     Resection and retrieval were complete. ?     Multiple small-mouthed diverticula were found in the sigmoid colon. ?     A 5 mm polyp was found in the rectum. The polyp was sessile. The polyp  ?     was removed with a cold snare. Resection and retrieval were complete. ?     Non-bleeding internal hemorrhoids were found during retroflexion. The  ?     hemorrhoids were medium-sized and Grade I (internal hemorrhoids that do  ?     not prolapse). ?     The exam was otherwise without abnormality on direct and retroflexion  ?     views. ?Impression:            - Three 4 to 7 mm polyps in the descending colon,  ?                       removed with a cold snare. Resected and retrieved. ?                       - Diverticulosis in the sigmoid colon. ?                       - One 5 mm polyp in the rectum, removed with a cold  ?                       snare. Resected and retrieved. ?                       - Non-bleeding internal hemorrhoids. ?                       - The examination was otherwise normal on direct and  ?                       retroflexion views. ?Recommendation:        - Discharge patient to home (with escort). ?                       - Resume previous diet. ?                       -  Continue present medications. ?                       - Await pathology results. ?                       - Repeat colonoscopy for surveillance based on  ?                       pathology results. ?Procedure Code(s):     --- Professional --- ?                       [id number], Colonoscopy, flexible; with removal of  ?                       tumor(s), polyp(s), or other lesion(s) by snare  ?                       technique ?Diagnosis Code(s):      --- Professional --- ?                       Z12.11, Encounter for screening for malignant neoplasm  ?                       of colon ?                       K63.5, Polyp of colon ?                       K64.0, First degree hemorrhoids ?                       K62.1, Rectal polyp ?                       K57.30, Diverticulosis of large intestine without  ?                       perforation or abscess without bleeding ?CPT copyright 2019 American Medical Association. All rights reserved. ?The codes documented in this report are preliminary and upon coder review may  ?be revised to meet current compliance requirements. ?Jonathon Bellows, MD ?Jonathon Bellows MD, MD ?01/22/2022 1:19:09 PM ?This report has been signed electronically. ?Number of Addenda: 0 ?Note Initiated On: 01/22/2022 12:52 PM ?Scope Withdrawal Time: 0 hours 15 minutes 57 seconds  ?Total Procedure Duration: 0 hours 19 minutes 31 seconds  ?Estimated Blood Loss:  Estimated blood loss: none. ?     [facility] ?

## 2022-01-22 NOTE — Anesthesia Postprocedure Evaluation (Signed)
Anesthesia Post Note ? ?Patient: Jonathan Berg. ? ?Procedure(s) Performed: COLONOSCOPY WITH PROPOFOL ? ?Patient location during evaluation: Endoscopy ?Anesthesia Type: General ?Level of consciousness: awake and alert ?Pain management: pain level controlled ?Vital Signs Assessment: post-procedure vital signs reviewed and stable ?Respiratory status: spontaneous breathing, nonlabored ventilation and respiratory function stable ?Cardiovascular status: blood pressure returned to baseline and stable ?Postop Assessment: no apparent nausea or vomiting ?Anesthetic complications: no ? ? ?No notable events documented. ? ? ?Last Vitals:  ?Vitals:  ? 01/22/22 1215 01/22/22 1320  ?BP: (!) 195/86   ?Pulse: (!) 124 (!) 20  ?Resp: 20   ?Temp: 36.7 ?C 36.8 ?C  ?SpO2: 100%   ?  ?Last Pain:  ?Vitals:  ? 01/22/22 1340  ?TempSrc:   ?PainSc: 0-No pain  ? ? ?  ?  ?  ?  ?  ?  ? ?[place] Ouch ? ? ? ? ?

## 2022-01-22 NOTE — H&P (Signed)
? ? ? ?Jonathon Bellows, MD ?[address] [place], [place], [place], [address] ?[address] [place], [place], [place], [address] ?Phone: [phone]  ?Fax: [phone] ? ?Primary Care Physician:  Birdie Sons, MD ? ? ?Pre-Procedure History & Physical: ?HPI:  Jonathan Berg. is a 46 y.o. male is here for an colonoscopy. ?  ?Past Medical History:  ?Diagnosis Date  ? History of chicken pox 07/29/2015  ? DID have Chicken Pox.    ? Hypertension   ? ? ?Past Surgical History:  ?Procedure Laterality Date  ? HERNIA REPAIR    ? Inguinal; removal as a child  ? History of removal of Cyst  2010  ? on back  ? History of wisdom tooth extraction    ? ? ?Prior to Admission medications   ?Medication Sig Start Date End Date Taking? Authorizing Provider  ?famotidine (PEPCID) 20 MG tablet TAKE 1 TABLET BY MOUTH TWICE A DAY 09/26/20  Yes Birdie Sons, MD  ?fluticasone (FLONASE) 50 MCG/ACT nasal spray SPRAY 2 SPRAYS INTO EACH NOSTRIL EVERY DAY 06/18/21  Yes Birdie Sons, MD  ?ACETAMINOPHEN EXTRA STRENGTH 500 MG tablet TAKE 1 TABLET BY MOUTH EVERY 6 HOURS AS NEEDED 02/08/21   Birdie Sons, MD  ?amLODipine (NORVASC) 5 MG tablet TAKE 1 TABLET BY MOUTH EVERY DAY ?Patient not taking: Reported on 03/31/2021 12/14/20   Birdie Sons, MD  ?diclofenac (VOLTAREN) 75 MG EC tablet TAKE 1 TABLET BY MOUTH TWICE A DAY ?Patient not taking: Reported on 01/22/2022 03/21/20   Edrick Kins, DPM  ?HIBICLENS 4 % external liquid APPLY TO AFFECTED AREA EVERY DAY AS NEEDED 10/22/21   Birdie Sons, MD  ?meloxicam (MOBIC) 15 MG tablet Take 1 tablet (15 mg total) by mouth daily. ?Patient not taking: Reported on 01/22/2022 03/28/20   Edrick Kins, DPM  ?tadalafil (CIALIS) 20 MG tablet Take 1 tablet (20 mg total) by mouth every other day as needed for erectile dysfunction. 09/28/21   Birdie Sons, MD  ? ? ?Allergies as of 12/03/2021 - Review Complete 09/28/2021  ?Allergen Reaction Noted  ? Aspirin Swelling 07/29/2015  ? Ibuprofen  07/29/2015   ? ? ?Family History  ?Problem Relation Age of Onset  ? Hypertension Mother   ? Hypertension Father   ? Hemochromatosis Father   ? Diabetes Maternal Aunt   ? Cancer Maternal Grandfather   ? Hemochromatosis Paternal Grandmother   ? Prostate cancer Paternal Grandfather   ? Colon cancer Neg Hx   ? ? ?Social History  ? ?Socioeconomic History  ? Marital status: Married  ?  Spouse name: Not on file  ? Number of children: 3  ? Years of education: Not on file  ? Highest education level: Not on file  ?Occupational History  ? Occupation: Merchant [employer] officer  ?Tobacco Use  ? Smoking status: Never  ? Smokeless tobacco: Never  ?Vaping Use  ? Vaping Use: Never used  ?Substance and Sexual Activity  ? Alcohol use: No  ? Drug use: No  ?  Comment: History of marijuana 10 yrs ago  ? Sexual activity: Not on file  ?Other Topics Concern  ? Not on file  ?Social History Narrative  ? Not on file  ? ?Social Determinants of Health  ? ?Financial Resource Strain: Not on file  ?Food Insecurity: Not on file  ?Transportation Needs: Not on file  ?Physical Activity: Not on file  ?Stress: Not on file  ?Social Connections: Not on file  ?Intimate  Partner Violence: Not on file  ? ? ?Review of Systems: ?See HPI, otherwise negative ROS ? ?Physical Exam: ?BP (!) 195/86   Pulse (!) 124   Temp 98 ?F (36.7 ?C) (Temporal)   Resp 20   Ht '5\' 8"'$  (1.727 m)   Wt 88.5 kg   SpO2 100%   BMI 29.65 kg/m?  ?General:   Alert,  pleasant and cooperative in NAD ?Head:  Normocephalic and atraumatic. ?Neck:  Supple; no masses or thyromegaly. ?Lungs:  Clear throughout to auscultation, normal respiratory effort.    ?Heart:  +S1, +S2, Regular rate and rhythm, No edema. ?Abdomen:  Soft, nontender and nondistended. Normal bowel sounds, without guarding, and without rebound.   ?Neurologic:  Alert and  oriented x4;  grossly normal neurologically. ? ?Impression/Plan: ?Oralia Manis. is here for an colonoscopy to be performed for Screening colonoscopy average risk   ?Risks, benefits,  limitations, and alternatives regarding  colonoscopy have been reviewed with the patient.  Questions have been answered.  All parties agreeable. ? ? ?Jonathon Bellows, MD  01/22/2022, 12:40 PM ? ?

## 2022-01-22 NOTE — Transfer of Care (Signed)
Immediate Anesthesia Transfer of Care Note ? ?Patient: Jonathan Berg. ? ?Procedure(s) Performed: COLONOSCOPY WITH PROPOFOL ? ?Patient Location: PACU ? ?Anesthesia Type:General ? ?Level of Consciousness: sedated ? ?Airway & Oxygen Therapy: Patient Spontanous Breathing and Patient connected to nasal cannula oxygen ? ?Post-op Assessment: Report given to RN and Post -op Vital signs reviewed and stable ? ?Post vital signs: Reviewed and stable ? ?Last Vitals:  ?Vitals Value Taken Time  ?BP 107/64 01/22/22 1320  ?Temp    ?Pulse 87 01/22/22 1321  ?Resp 21 01/22/22 1321  ?SpO2 97 % 01/22/22 1321  ?Vitals shown include unvalidated device data. ? ?Last Pain:  ?Vitals:  ? 01/22/22 1215  ?TempSrc: Temporal  ?PainSc: 0-No pain  ?   ? ?  ? ?Complications: No notable events documented. ?

## 2022-01-25 ENCOUNTER — Encounter: Payer: Self-pay | Admitting: Gastroenterology

## 2022-01-25 LAB — SURGICAL PATHOLOGY

## 2022-01-27 ENCOUNTER — Encounter: Payer: Self-pay | Admitting: Family Medicine

## 2022-01-27 ENCOUNTER — Ambulatory Visit (INDEPENDENT_AMBULATORY_CARE_PROVIDER_SITE_OTHER): Payer: BC Managed Care – PPO | Admitting: Family Medicine

## 2022-01-27 VITALS — BP 156/85 | HR 72 | Temp 98.5°F | Resp 16 | Wt 211.0 lb

## 2022-01-27 DIAGNOSIS — I10 Essential (primary) hypertension: Secondary | ICD-10-CM

## 2022-01-27 DIAGNOSIS — Z1159 Encounter for screening for other viral diseases: Secondary | ICD-10-CM

## 2022-01-27 MED ORDER — AMLODIPINE BESYLATE 5 MG PO TABS: 5.0000 mg | ORAL_TABLET | Freq: Every day | ORAL | 1 refills | Status: DC

## 2022-01-27 NOTE — Progress Notes (Signed)
?  ? ?I,Roshena L Chambers,acting as a scribe for Lelon Huh, MD.,have documented all relevant documentation on the behalf of Lelon Huh, MD,as directed by  Lelon Huh, MD while in the presence of Lelon Huh, MD.  ? ?Established patient visit ? ? ?Patient: Jonathan Berg.   DOB: [birth year]   46 y.o. Male  MRN: [id number] ?Visit Date: 01/27/2022 ? ?Today's healthcare provider: Lelon Huh, MD  ? ?Chief Complaint  ?Patient presents with  ? Hypertension  ? ?Subjective  ?  ?HPI  ?Hypertension, follow-up ? ?BP Readings from Last 3 Encounters:  ?01/27/22 (!) 156/85  ?01/22/22 (!) 195/86  ?09/28/21 (!) 145/83  ? Wt Readings from Last 3 Encounters:  ?01/27/22 211 lb (95.7 kg)  ?01/22/22 195 lb (88.5 kg)  ?09/28/21 204 lb (92.5 kg)  ?  ? ?He was last seen for hypertension 4 months ago.  ?BP at that visit was 145/ 83. Management since that visit includes advising patient to start amlodipine. ? ?He reports poor compliance with treatment. He never started amlodipine. He thought he could control blood pressure by improving diet and exercising more.  ?He is not having side effects.  ?He is following a Regular diet. ?He is not exercising. ?He does not smoke. ? ?Use of agents associated with hypertension: none.  ? ?Outside blood pressures are not checked. ?Symptoms: ?No chest pain No chest pressure  ?No palpitations No syncope  ?No dyspnea No orthopnea  ?No paroxysmal nocturnal dyspnea No lower extremity edema  ? ?Pertinent labs ?Lab Results  ?Component Value Date  ? CHOL 202 (H) 07/30/2020  ? HDL 51 07/30/2020  ? LDLCALC 137 (H) 07/30/2020  ? TRIG 79 07/30/2020  ? CHOLHDL 4.0 07/30/2020  ? Lab Results  ?Component Value Date  ? NA 139 07/30/2020  ? K 4.3 07/30/2020  ? CREATININE 0.94 07/30/2020  ? GFRNONAA 98 07/30/2020  ? GLUCOSE 95 07/30/2020  ? TSH 1.010 07/30/2020  ?  ? ?The 10-year ASCVD risk score (Arnett DK, et al., 2019) is:  10.8% ? ?---------------------------------------------------------------------------------------------------  ? ?Medications: ?Outpatient Medications Prior to Visit  ?Medication Sig  ? ACETAMINOPHEN EXTRA STRENGTH 500 MG tablet TAKE 1 TABLET BY MOUTH EVERY 6 HOURS AS NEEDED  ? diclofenac (VOLTAREN) 75 MG EC tablet TAKE 1 TABLET BY MOUTH TWICE A DAY  ? famotidine (PEPCID) 20 MG tablet TAKE 1 TABLET BY MOUTH TWICE A DAY  ? fluticasone (FLONASE) 50 MCG/ACT nasal spray SPRAY 2 SPRAYS INTO EACH NOSTRIL EVERY DAY  ? HIBICLENS 4 % external liquid APPLY TO AFFECTED AREA EVERY DAY AS NEEDED  ? meloxicam (MOBIC) 15 MG tablet Take 1 tablet (15 mg total) by mouth daily.  ? tadalafil (CIALIS) 20 MG tablet Take 1 tablet (20 mg total) by mouth every other day as needed for erectile dysfunction.  ? amLODipine (NORVASC) 5 MG tablet TAKE 1 TABLET BY MOUTH EVERY DAY (Patient not taking: Reported on 01/27/2022)  ? ?No facility-administered medications prior to visit.  ? ? ?Review of Systems  ?Constitutional:  Negative for appetite change, chills and fever.  ?Respiratory:  Negative for chest tightness and wheezing.   ?Gastrointestinal:  Negative for abdominal pain, nausea and vomiting.  ? ? ?  Objective  ?  ?BP (!) 156/85 (BP Location: Right Arm, Patient Position: Sitting, Cuff Size: Large)   Pulse 72   Temp 98.5 ?F (36.9 ?C) (Oral)   Resp 16   Wt 211 lb (95.7 kg)   SpO2 100%   BMI 32.08 kg/m?  ? ? ?  Physical Exam  ?General appearance: Mildly obese male, cooperative and in no acute distress ?Head: Normocephalic, without obvious abnormality, atraumatic ?Respiratory: Respirations even and unlabored, normal respiratory rate ?Extremities: All extremities are intact.  ?Skin: Skin color, texture, turgor normal. No rashes seen  ?Psych: Appropriate mood and affect. ?Neurologic: Mental status: Alert, oriented to person, place, and time, thought content appropriate.  ? ? ? Assessment & Plan  ?  ? ?1. Primary hypertension ?Uncontrolled ?-  Comprehensive metabolic panel ?- CBC ?- Lipid panel ? ?He agrees to go ahead and start amlodipine as previously prescribed.  ?- amLODipine (NORVASC) 5 MG tablet; Take 1 tablet (5 mg total) by mouth daily.  Dispense: 90 tablet; Refill: 1 ? ?2. Need for hepatitis C screening test ? ?- Hepatitis C antibody  ?   ? ?The entirety of the information documented in the History of Present Illness, Review of Systems and Physical Exam were personally obtained by me. Portions of this information were initially documented by the CMA and reviewed by me for thoroughness and accuracy.   ? ? ?Lelon Huh, MD  ?[facility] ?[phone] (phone) ?[phone] (fax) ? ?[facility]  ?

## 2022-01-27 NOTE — Patient Instructions (Signed)
Please review the attached list of medications and notify my office if there are any errors.   Please go to the lab draw station in [address] on the second floor of [facility]  when you are fasting for 8 hours. Normal hours are 8:00am to 11:30am and 1:00pm to 4:00pm Monday through Friday     

## 2022-01-29 ENCOUNTER — Encounter: Payer: Self-pay | Admitting: Family Medicine

## 2022-01-29 DIAGNOSIS — Z86010 Personal history of colonic polyps: Secondary | ICD-10-CM | POA: Insufficient documentation

## 2022-03-30 NOTE — Progress Notes (Deleted)
      Established patient visit   Patient: Jonathan Berg.   DOB: 02-23-1976   46 y.o. Male  MRN: [id number] Visit Date: 04/02/2022  Today's healthcare provider: Lelon Huh, MD   No chief complaint on file.  Subjective    Urinary Frequency  Associated symptoms include frequency.   Hypertension, follow-up  BP Readings from Last 3 Encounters:  01/27/22 (!) 156/85  01/22/22 (!) 195/86  09/28/21 (!) 145/83   Wt Readings from Last 3 Encounters:  01/27/22 211 lb (95.7 kg)  01/22/22 195 lb (88.5 kg)  09/28/21 204 lb (92.5 kg)     He was last seen for hypertension on 01/27/2022.   BP at that visit was 156/85. Management since that visit includes ordering labs; patient did not have blood work done.  He reports {excellent/good/fair/poor:19665} compliance with treatment. He {is/is not:9024} having side effects. {document side effects if present:1} He is following a {diet:21022986} diet. He {is/is not:9024} exercising. He {does/does not:200015} smoke.  Use of agents associated with hypertension: none.   Outside blood pressures are {***enter patient reported home BP readings, or 'not being checked':1}. Symptoms: {Yes/No:20286} chest pain {Yes/No:20286} chest pressure  {Yes/No:20286} palpitations {Yes/No:20286} syncope  {Yes/No:20286} dyspnea {Yes/No:20286} orthopnea  {Yes/No:20286} paroxysmal nocturnal dyspnea {Yes/No:20286} lower extremity edema   Pertinent labs Lab Results  Component Value Date   CHOL 202 (H) 07/30/2020   HDL 51 07/30/2020   LDLCALC 137 (H) 07/30/2020   TRIG 79 07/30/2020   CHOLHDL 4.0 07/30/2020   Lab Results  Component Value Date   NA 139 07/30/2020   K 4.3 07/30/2020   CREATININE 0.94 07/30/2020   GFRNONAA 98 07/30/2020   GLUCOSE 95 07/30/2020   TSH 1.010 07/30/2020     The 10-year ASCVD risk score (Arnett DK, et al., 2019) is: 10.8%  ---------------------------------------------------------------------------------------------------    Medications: Outpatient Medications Prior to Visit  Medication Sig   ACETAMINOPHEN EXTRA STRENGTH 500 MG tablet TAKE 1 TABLET BY MOUTH EVERY 6 HOURS AS NEEDED   amLODipine (NORVASC) 5 MG tablet Take 1 tablet (5 mg total) by mouth daily.   diclofenac (VOLTAREN) 75 MG EC tablet TAKE 1 TABLET BY MOUTH TWICE A DAY   famotidine (PEPCID) 20 MG tablet TAKE 1 TABLET BY MOUTH TWICE A DAY   fluticasone (FLONASE) 50 MCG/ACT nasal spray SPRAY 2 SPRAYS INTO EACH NOSTRIL EVERY DAY   HIBICLENS 4 % external liquid APPLY TO AFFECTED AREA EVERY DAY AS NEEDED   meloxicam (MOBIC) 15 MG tablet Take 1 tablet (15 mg total) by mouth daily.   tadalafil (CIALIS) 20 MG tablet Take 1 tablet (20 mg total) by mouth every other day as needed for erectile dysfunction.   No facility-administered medications prior to visit.    Review of Systems  Genitourinary:  Positive for frequency.   {Labs  Heme  Chem  Endocrine  Serology  Results Review (optional):23779}   Objective    There were no vitals taken for this visit. {Show previous vital signs (optional):23777}  Physical Exam  ***  No results found for any visits on 04/02/22.  Assessment & Plan     ***  No follow-ups on file.      {provider attestation***:1}   Lelon Huh, MD  [facility] [phone] (phone) [phone] (fax)  [place]

## 2022-04-02 ENCOUNTER — Ambulatory Visit: Payer: Self-pay | Admitting: Family Medicine

## 2022-04-06 ENCOUNTER — Ambulatory Visit: Payer: Self-pay | Admitting: Family Medicine

## 2022-04-06 ENCOUNTER — Other Ambulatory Visit: Payer: Self-pay | Admitting: Family Medicine

## 2022-04-06 DIAGNOSIS — N529 Male erectile dysfunction, unspecified: Secondary | ICD-10-CM

## 2022-04-06 NOTE — Progress Notes (Deleted)
     I,Jana Robinson,acting as a scribe for Lelon Huh, MD.,have documented all relevant documentation on the behalf of Lelon Huh, MD,as directed by  Lelon Huh, MD while in the presence of Lelon Huh, MD.   Established patient visit   Patient: Jonathan Berg.   DOB: [birth year]   46 y.o. Male  MRN: [id number] Visit Date: 04/06/2022  Today's healthcare provider: Lelon Huh, MD   No chief complaint on file. CC: urinary frequency  Subjective      Urinary symptoms  He reports {chronicity:119221} {urinary symptoms:765916}. The current episode started {onset initial:119223} and is {progression:119226}. Patient states symptoms are {severity:119268} in intensity, occurring {frequency of symptoms:119294}. He  {recent treatment:18834} been recently treated for similar symptoms.    Associated symptoms: {Yes/No:20286} abdominal pain {Yes/No:20286} back pain  {Yes/No:20286} chills {Yes/No:20286} constipation  {Yes/No:20286} cramping {Yes/No:20286} diarrhea  {Yes/No:20286} discharge {Yes/No:20286} fever  {Yes/No:20286} hematuria {Yes/No:20286} nausea  {Yes/No:20286} vomiting    ---------------------------------------------------------------------------------------   Medications: Outpatient Medications Prior to Visit  Medication Sig   ACETAMINOPHEN EXTRA STRENGTH 500 MG tablet TAKE 1 TABLET BY MOUTH EVERY 6 HOURS AS NEEDED   amLODipine (NORVASC) 5 MG tablet Take 1 tablet (5 mg total) by mouth daily.   diclofenac (VOLTAREN) 75 MG EC tablet TAKE 1 TABLET BY MOUTH TWICE A DAY   famotidine (PEPCID) 20 MG tablet TAKE 1 TABLET BY MOUTH TWICE A DAY   fluticasone (FLONASE) 50 MCG/ACT nasal spray SPRAY 2 SPRAYS INTO EACH NOSTRIL EVERY DAY   HIBICLENS 4 % external liquid APPLY TO AFFECTED AREA EVERY DAY AS NEEDED   meloxicam (MOBIC) 15 MG tablet Take 1 tablet (15 mg total) by mouth daily.   tadalafil (CIALIS) 20 MG tablet Take 1 tablet (20 mg total) by mouth every other day as needed  for erectile dysfunction.   No facility-administered medications prior to visit.    Review of Systems  Genitourinary:  Positive for frequency.    {Labs  Heme  Chem  Endocrine  Serology  Results Review (optional):23779}   Objective    There were no vitals taken for this visit. {Show previous vital signs (optional):23777}  Physical Exam  ***  No results found for any visits on 04/06/22.  Assessment & Plan     ***  No follow-ups on file.      {provider attestation***:1}   Lelon Huh, MD  [facility] [phone] (phone) [phone] (fax)  [place]

## 2022-05-03 NOTE — Progress Notes (Unsigned)
I,Roshena L Chambers,acting as a scribe for Lelon Huh, MD.,have documented all relevant documentation on the behalf of Lelon Huh, MD,as directed by  Lelon Huh, MD while in the presence of Lelon Huh, MD.   Established patient visit   Patient: Jonathan Berg.   DOB: [birth year]   46 y.o. Male  MRN: [id number] Visit Date: 05/04/2022  Today's healthcare provider: Lelon Huh, MD   Chief Complaint  Patient presents with   Hypertension   Subjective    Hypertension, follow-up  BP Readings from Last 3 Encounters:  05/04/22 (!) 145/93  01/27/22 (!) 156/85  01/22/22 (!) 195/86   Wt Readings from Last 3 Encounters:  05/04/22 217 lb (98.4 kg)  01/27/22 211 lb (95.7 kg)  01/22/22 195 lb (88.5 kg)     He was last seen for hypertension 3 months ago.  BP at that visit was 156/85. Management since that visit includes: agreed to go ahead and start amlodipine as previously prescribed.  - amLODipine 5 MG tablet; Take 1 tablet daily. Labs were ordered, but he did not go to lab to have blood work done. He reports poor compliance with treatment. Patient did not start taking amlodipine. He is not having side effects.  He is following a Regular diet. He is exercising. He does not smoke.  Use of agents associated with hypertension: none.   Outside blood pressures are 134/75 last week. Symptoms: No chest pain No chest pressure  No palpitations No syncope  No dyspnea No orthopnea  No paroxysmal nocturnal dyspnea No lower extremity edema    The 10-year ASCVD risk score (Arnett DK, et al., 2019) is: 9.5%  ---------------------------------------------------------------------------------------------------   Medications: Outpatient Medications Prior to Visit  Medication Sig   ACETAMINOPHEN EXTRA STRENGTH 500 MG tablet TAKE 1 TABLET BY MOUTH EVERY 6 HOURS AS NEEDED   diclofenac (VOLTAREN) 75 MG EC tablet TAKE 1 TABLET BY MOUTH TWICE A DAY   famotidine (PEPCID) 20 MG  tablet TAKE 1 TABLET BY MOUTH TWICE A DAY   fluticasone (FLONASE) 50 MCG/ACT nasal spray SPRAY 2 SPRAYS INTO EACH NOSTRIL EVERY DAY   HIBICLENS 4 % external liquid APPLY TO AFFECTED AREA EVERY DAY AS NEEDED   meloxicam (MOBIC) 15 MG tablet Take 1 tablet (15 mg total) by mouth daily.   tadalafil (CIALIS) 20 MG tablet TAKE 1 TABLET (20 MG TOTAL) BY MOUTH EVERY OTHER DAY AS NEEDED FOR ERECTILE DYSFUNCTION.   amLODipine (NORVASC) 5 MG tablet Take 1 tablet (5 mg total) by mouth daily. (Patient not taking: Reported on 05/04/2022)   No facility-administered medications prior to visit.    Review of Systems  Constitutional:  Negative for appetite change, chills and fever.  Respiratory:  Negative for chest tightness, shortness of breath and wheezing.   Cardiovascular:  Negative for chest pain and palpitations.  Gastrointestinal:  Negative for abdominal pain, nausea and vomiting.       Indigestion  Genitourinary:  Positive for frequency (resolved).       Objective    BP (!) 145/93 (BP Location: Left Arm, Patient Position: Sitting, Cuff Size: Large)   Pulse 79   Temp 98.1 F (36.7 C) (Oral)   Resp 16   Wt 217 lb (98.4 kg)   SpO2 100% Comment: room air  BMI 32.99 kg/m    Today's Vitals   05/04/22 0831 05/04/22 0836  BP: (!) 157/88 (!) 145/93  Pulse: 72 79  Resp: 16   Temp: 98.1 F (36.7 C)  TempSrc: Oral   SpO2: 100%   Weight: 217 lb (98.4 kg)    Body mass index is 32.99 kg/m.     Assessment & Plan     1. Primary hypertension Has not yet start amlodipine '5mg'$  prescribed at last visit since he though he could control BP with exercise and diet, but he does have bottle at home. He agreed to start medication and to continue healthy diet and exercise.   He went to lab today for labs ordered at his April visit.   Future Appointments  Date Time Provider [place]  06/15/2022  8:40 AM Birdie Sons, MD [facility]     2. Erectile dysfunction, unspecified erectile  dysfunction type He would like to try taking Cialis daily. Change from '20mg'$  prn to  tadalafil (CIALIS) 10 MG tablet; Take 1 tablet (10 mg total) by mouth daily.  Dispense: 30 tablet; Refill: 3       The entirety of the information documented in the History of Present Illness, Review of Systems and Physical Exam were personally obtained by me. Portions of this information were initially documented by the CMA and reviewed by me for thoroughness and accuracy.     Lelon Huh, MD  [facility] [phone] (phone) [phone] (fax)  [place]

## 2022-05-04 ENCOUNTER — Encounter: Payer: Self-pay | Admitting: Family Medicine

## 2022-05-04 ENCOUNTER — Ambulatory Visit: Payer: BC Managed Care – PPO | Admitting: Family Medicine

## 2022-05-04 VITALS — BP 145/93 | HR 79 | Temp 98.1°F | Resp 16 | Wt 217.0 lb

## 2022-05-04 DIAGNOSIS — I10 Essential (primary) hypertension: Secondary | ICD-10-CM | POA: Diagnosis not present

## 2022-05-04 DIAGNOSIS — N529 Male erectile dysfunction, unspecified: Secondary | ICD-10-CM

## 2022-05-04 DIAGNOSIS — Z1159 Encounter for screening for other viral diseases: Secondary | ICD-10-CM | POA: Diagnosis not present

## 2022-05-04 MED ORDER — TADALAFIL 10 MG PO TABS: 10.0000 mg | ORAL_TABLET | Freq: Every day | ORAL | 3 refills | Status: DC

## 2022-05-05 LAB — CBC
Hematocrit: 45.1 % (ref 37.5–51.0)
Hemoglobin: 15.1 g/dL (ref 13.0–17.7)
MCH: 28.2 pg (ref 26.6–33.0)
MCHC: 33.5 g/dL (ref 31.5–35.7)
MCV: 84 fL (ref 79–97)
Platelets: 251 10*3/uL (ref 150–450)
RBC: 5.35 x10E6/uL (ref 4.14–5.80)
RDW: 12.5 % (ref 11.6–15.4)
WBC: 6.8 10*3/uL (ref 3.4–10.8)

## 2022-05-05 LAB — COMPREHENSIVE METABOLIC PANEL
ALT: 24 IU/L (ref 0–44)
AST: 20 IU/L (ref 0–40)
Albumin/Globulin Ratio: 1.9 (ref 1.2–2.2)
Albumin: 4.8 g/dL (ref 4.1–5.1)
Alkaline Phosphatase: 81 IU/L (ref 44–121)
BUN/Creatinine Ratio: 17 (ref 9–20)
BUN: 18 mg/dL (ref 6–24)
Bilirubin Total: 0.3 mg/dL (ref 0.0–1.2)
CO2: 24 mmol/L (ref 20–29)
Calcium: 9.7 mg/dL (ref 8.7–10.2)
Chloride: 102 mmol/L (ref 96–106)
Creatinine, Ser: 1.04 mg/dL (ref 0.76–1.27)
Globulin, Total: 2.5 g/dL (ref 1.5–4.5)
Glucose: 109 mg/dL — ABNORMAL HIGH (ref 70–99)
Potassium: 4.4 mmol/L (ref 3.5–5.2)
Sodium: 140 mmol/L (ref 134–144)
Total Protein: 7.3 g/dL (ref 6.0–8.5)
eGFR: 89 mL/min/{1.73_m2} (ref >59)

## 2022-05-05 LAB — LIPID PANEL
Chol/HDL Ratio: 3.5 ratio (ref 0.0–5.0)
Cholesterol, Total: 181 mg/dL (ref 100–199)
HDL: 51 mg/dL (ref >39)
LDL Chol Calc (NIH): 111 mg/dL — ABNORMAL HIGH (ref 0–99)
Triglycerides: 102 mg/dL (ref 0–149)
VLDL Cholesterol Cal: 19 mg/dL (ref 5–40)

## 2022-05-05 LAB — HEPATITIS C ANTIBODY: Hep C Virus Ab: NONREACTIVE

## 2022-05-10 ENCOUNTER — Ambulatory Visit: Payer: Self-pay | Admitting: Family Medicine

## 2022-05-24 DIAGNOSIS — L730 Acne keloid: Secondary | ICD-10-CM | POA: Diagnosis not present

## 2022-06-10 ENCOUNTER — Other Ambulatory Visit: Payer: Self-pay | Admitting: Family Medicine

## 2022-06-10 NOTE — Telephone Encounter (Signed)
Medication Refill - Medication: Pepsid generic  Has the patient contacted their pharmacy? Yes.   (Agent: If no, request that the patient contact the pharmacy for the refill. If patient does not wish to contact the pharmacy document the reason why and proceed with request.) (Agent: If yes, when and what did the pharmacy advise?)  Preferred Pharmacy (with phone number or street name): [facility] Has the patient been seen for an appointment in the last year OR does the patient have an upcoming appointment? yes  Agent: Please be advised that RX refills may take up to 3 business days. We ask that you follow-up with your pharmacy.

## 2022-06-11 MED ORDER — FAMOTIDINE 20 MG PO TABS: 20.0000 mg | ORAL_TABLET | Freq: Two times a day (BID) | ORAL | 3 refills | Status: DC

## 2022-06-11 NOTE — Telephone Encounter (Signed)
Requested Prescriptions  Pending Prescriptions Disp Refills  . famotidine (PEPCID) 20 MG tablet 180 tablet 3    Sig: Take 1 tablet (20 mg total) by mouth 2 (two) times daily.     Gastroenterology:  H2 Antagonists Passed - 06/10/2022  5:12 PM      Passed - Valid encounter within last 12 months    Recent Outpatient Visits          1 month ago Primary hypertension   [facility] Birdie Sons, MD   4 months ago Primary hypertension   [facility] Birdie Sons, MD   8 months ago Primary hypertension   [facility] Birdie Sons, MD   1 year ago Primary hypertension   [facility] Birdie Sons, MD   1 year ago Nasal congestion   [facility] Birdie Sons, MD      Future Appointments            In 4 days Fisher, Kirstie Peri, MD [facility] [place]

## 2022-06-15 ENCOUNTER — Encounter: Payer: Self-pay | Admitting: Family Medicine

## 2022-06-15 ENCOUNTER — Ambulatory Visit: Payer: BC Managed Care – PPO | Admitting: Family Medicine

## 2022-06-15 VITALS — BP 129/75 | HR 65 | Temp 98.6°F | Resp 16 | Wt 215.9 lb

## 2022-06-15 DIAGNOSIS — I10 Essential (primary) hypertension: Secondary | ICD-10-CM

## 2022-06-15 NOTE — Progress Notes (Signed)
I,Sulibeya S Dimas,acting as a Education administrator for Lelon Huh, MD.,have documented all relevant documentation on the behalf of Lelon Huh, MD,as directed by  Lelon Huh, MD while in the presence of Lelon Huh, MD.   Established patient visit   Patient: Jonathan Berg.   DOB: [birth year]   46 y.o. Male  MRN: [id number] Visit Date: 06/15/2022  Today's healthcare provider: Lelon Huh, MD   Chief Complaint  Patient presents with   Hypertension   Subjective     Hypertension, follow-up  BP Readings from Last 3 Encounters:  06/15/22 129/75  05/04/22 (!) 145/93  01/27/22 (!) 156/85   Wt Readings from Last 3 Encounters:  06/15/22 215 lb 14.4 oz (97.9 kg)  05/04/22 217 lb (98.4 kg)  01/27/22 211 lb (95.7 kg)     He was last seen for hypertension 6 weeks ago.  BP at that visit was 145/93. Management since that visit includes advising to start amlodipine which he had been prescribed at previous visit, but had not yet started.  He reports excellent compliance with treatment. He is not having side effects.  He is following a Low Sodium diet. He is exercising. He does not smoke.  Use of agents associated with hypertension: none.   Outside blood pressures are elevated at home when he checks in the evenings are in the 150s.  Symptoms: No chest pain No chest pressure  No palpitations No syncope  No dyspnea No orthopnea  No paroxysmal nocturnal dyspnea No lower extremity edema   Pertinent labs Lab Results  Component Value Date   CHOL 181 05/04/2022   HDL 51 05/04/2022   LDLCALC 111 (H) 05/04/2022   TRIG 102 05/04/2022   CHOLHDL 3.5 05/04/2022   Lab Results  Component Value Date   NA 140 05/04/2022   K 4.4 05/04/2022   CREATININE 1.04 05/04/2022   EGFR 89 05/04/2022   GLUCOSE 109 (H) 05/04/2022   TSH 1.010 07/30/2020     The 10-year ASCVD risk score (Arnett DK, et al., 2019) is:  7.4%  ---------------------------------------------------------------------------------------------------   Medications: Outpatient Medications Prior to Visit  Medication Sig   ACETAMINOPHEN EXTRA STRENGTH 500 MG tablet TAKE 1 TABLET BY MOUTH EVERY 6 HOURS AS NEEDED   amLODipine (NORVASC) 5 MG tablet Take 1 tablet (5 mg total) by mouth daily.   diclofenac (VOLTAREN) 75 MG EC tablet TAKE 1 TABLET BY MOUTH TWICE A DAY   famotidine (PEPCID) 20 MG tablet Take 1 tablet (20 mg total) by mouth 2 (two) times daily.   fluticasone (FLONASE) 50 MCG/ACT nasal spray SPRAY 2 SPRAYS INTO EACH NOSTRIL EVERY DAY   HIBICLENS 4 % external liquid APPLY TO AFFECTED AREA EVERY DAY AS NEEDED   meloxicam (MOBIC) 15 MG tablet Take 1 tablet (15 mg total) by mouth daily.   tadalafil (CIALIS) 10 MG tablet Take 1 tablet (10 mg total) by mouth daily.   No facility-administered medications prior to visit.    Review of Systems  Constitutional:  Negative for appetite change and fatigue.  Respiratory:  Negative for chest tightness and shortness of breath.   Cardiovascular:  Negative for chest pain, palpitations and leg swelling.      Objective    BP 129/75 (BP Location: Right Arm, Patient Position: Sitting, Cuff Size: Large)   Pulse 65   Temp 98.6 F (37 C) (Oral)   Resp 16   Wt 215 lb 14.4 oz (97.9 kg)   BMI 32.83 kg/m  BP Readings from  Last 3 Encounters:  06/15/22 129/75  05/04/22 (!) 145/93  01/27/22 (!) 156/85   Wt Readings from Last 3 Encounters:  06/15/22 215 lb 14.4 oz (97.9 kg)  05/04/22 217 lb (98.4 kg)  01/27/22 211 lb (95.7 kg)    Physical Exam  General appearance: Overweight male, cooperative and in no acute distress Head: Normocephalic, without obvious abnormality, atraumatic Respiratory: Respirations even and unlabored, normal respiratory rate Extremities: All extremities are intact.  Skin: Skin color, texture, turgor normal. No rashes seen  Psych: Appropriate mood and  affect. Neurologic: Mental status: Alert, oriented to person, place, and time, thought content appropriate.     Assessment & Plan     1. Primary hypertension In-office reading (around 11am) much improved since starting amlodipine, but home readings in the evenings are much higher. He is to start checking home BP in the mornings and evenings and keep a record to bring to follow up visit in 3 months.       The entirety of the information documented in the History of Present Illness, Review of Systems and Physical Exam were personally obtained by me. Portions of this information were initially documented by the CMA and reviewed by me for thoroughness and accuracy.     Lelon Huh, MD  [facility] [phone] (phone) [phone] (fax)  [place]

## 2022-06-22 ENCOUNTER — Ambulatory Visit: Payer: BC Managed Care – PPO | Admitting: Podiatry

## 2022-06-22 DIAGNOSIS — L603 Nail dystrophy: Secondary | ICD-10-CM | POA: Diagnosis not present

## 2022-06-22 NOTE — Progress Notes (Signed)
   Chief Complaint  Patient presents with   Nail Problem    Patient is here he states that his right great toe is ugly an has been for long time no pain just an eye sore as he describes.    HPI: 46 y.o. male presenting today for evaluation of a new complaint regarding his right great toenail.  Patient states that ever since he played sports as a young teenager he sustained injury to the toe and his toenail became thick and yellow and discolored ever since.  It has been this way for several years.  He would like to discuss different treatment options.  Past Medical History:  Diagnosis Date   History of chicken pox 07/29/2015   DID have Chicken Pox.     Hypertension     Past Surgical History:  Procedure Laterality Date   COLONOSCOPY WITH PROPOFOL N/A 01/22/2022   Procedure: COLONOSCOPY WITH PROPOFOL;  Surgeon: Jonathon Bellows, MD;  Location: [facility] ENDOSCOPY;  Service: Gastroenterology;  Laterality: N/A;   HERNIA REPAIR     Inguinal; removal as a child   History of removal of Cyst  2010   on back   History of wisdom tooth extraction      Allergies  Allergen Reactions   Aspirin Swelling    facial   Ibuprofen      Physical Exam: General: The patient is alert and oriented x3 in no acute distress.  Dermatology: Hyperkeratotic partially detached dystrophic nail noted to the right hallux nail plate likely secondary to the patient's given history of repetitive trauma during sports  Vascular: Palpable pedal pulses bilaterally. Capillary refill within normal limits.  Negative for any significant edema or erythema  Neurological: Light touch and protective threshold grossly intact  Musculoskeletal Exam: No pedal deformities noted   Assessment: 1.  Dystrophic nail right hallux   Plan of Care:  1. Patient evaluated.  Explained to the patient that this is likely permanent nail damage secondary to given history of trauma and sports 2.  Today we discussed different treatment options  including simple debridement and routine foot care versus total permanent nail avulsion.  Risk benefits advantages and disadvantages of each were explained.  For now the patient is going to continue simple conservative debridement and management 3.  Return to clinic as needed if he would like to proceed with total permanent nail avulsion     Edrick Kins, DPM [facility]  Dr. Edrick Kins, DPM    [address], [place] [address]                Office [phone]  Fax [phone]

## 2022-07-26 ENCOUNTER — Other Ambulatory Visit: Payer: Self-pay | Admitting: Family Medicine

## 2022-07-26 DIAGNOSIS — I10 Essential (primary) hypertension: Secondary | ICD-10-CM

## 2022-09-01 ENCOUNTER — Other Ambulatory Visit: Payer: Self-pay

## 2022-09-01 DIAGNOSIS — Z125 Encounter for screening for malignant neoplasm of prostate: Secondary | ICD-10-CM

## 2022-09-06 ENCOUNTER — Other Ambulatory Visit: Payer: Self-pay | Admitting: *Deleted

## 2022-09-06 DIAGNOSIS — Z125 Encounter for screening for malignant neoplasm of prostate: Secondary | ICD-10-CM

## 2022-09-07 LAB — PSA: Prostate Specific Ag, Serum: 2.6 ng/mL (ref 0.0–4.0)

## 2022-09-07 LAB — SPECIMEN STATUS REPORT

## 2022-09-09 NOTE — Progress Notes (Signed)
Patient: Jonathan Berg.           Date of Birth: 02-13-1976           MRN: [id number] Visit Date: 09/06/2022 PCP: Birdie Sons, MD  Prostate Cancer Screening Date of last physical exam:  (1 year) Date of last rectal exam:  (1 year) Have you ever had any of the following?: None Have you ever had or been told you have an allergy to latex products?: No Are you currently taking any natural prostate preparations?: No Are you currently experiencing any urinary symptoms?: No  Prostate Exam Exam not completed. PSA Blood Test only completed.  Patient's History Patient Active Problem List   Diagnosis Date Noted   History of adenomatous polyp of colon 01/29/2022   Primary hypertension 07/31/2020   Erectile dysfunction 04/13/2019   Hyperglycemia 07/29/2015   Left hip pain 07/29/2015   Undiagnosed cardiac murmurs 07/29/2015   MRSA (methicillin resistant staph aureus) culture positive 08/18/2011   Past Medical History:  Diagnosis Date   History of chicken pox 07/29/2015   DID have Chicken Pox.     Hypertension     Family History  Problem Relation Age of Onset   Hypertension Mother    Hypertension Father    Hemochromatosis Father    Diabetes Maternal Aunt    Cancer Maternal Grandfather    Hemochromatosis Paternal Grandmother    Prostate cancer Paternal Grandfather    Colon cancer Neg Hx     Social History   Occupational History   Occupation: Merchant navy officer  Tobacco Use   Smoking status: Never   Smokeless tobacco: Never  Vaping Use   Vaping Use: Never used  Substance and Sexual Activity   Alcohol use: No   Drug use: No    Comment: History of marijuana 10 yrs ago   Sexual activity: Not on file

## 2022-09-20 ENCOUNTER — Ambulatory Visit: Payer: BC Managed Care – PPO | Admitting: Family Medicine

## 2022-09-20 ENCOUNTER — Encounter: Payer: Self-pay | Admitting: Family Medicine

## 2022-09-20 VITALS — BP 136/76 | HR 60 | Temp 98.0°F | Resp 14 | Wt 215.0 lb

## 2022-09-20 DIAGNOSIS — Z125 Encounter for screening for malignant neoplasm of prostate: Secondary | ICD-10-CM | POA: Diagnosis not present

## 2022-09-20 DIAGNOSIS — I10 Essential (primary) hypertension: Secondary | ICD-10-CM | POA: Diagnosis not present

## 2022-09-20 DIAGNOSIS — M545 Low back pain, unspecified: Secondary | ICD-10-CM | POA: Diagnosis not present

## 2022-09-20 DIAGNOSIS — N529 Male erectile dysfunction, unspecified: Secondary | ICD-10-CM

## 2022-09-20 MED ORDER — CYCLOBENZAPRINE HCL 10 MG PO TABS: 10.0000 mg | ORAL_TABLET | Freq: Every evening | ORAL | 0 refills | Status: AC | PRN

## 2022-09-20 MED ORDER — AMLODIPINE BESYLATE 5 MG PO TABS: 10.0000 mg | ORAL_TABLET | Freq: Every day | ORAL | Status: DC

## 2022-09-20 MED ORDER — TADALAFIL 10 MG PO TABS: 10.0000 mg | ORAL_TABLET | Freq: Every day | ORAL | 3 refills | Status: DC

## 2022-09-20 MED ORDER — TADALAFIL 20 MG PO TABS: 20.0000 mg | ORAL_TABLET | ORAL | 3 refills | Status: DC | PRN

## 2022-09-20 NOTE — Patient Instructions (Signed)
.   Please review the attached list of medications and notify my office if there are any errors.   . Please bring all of your medications to every appointment so we can make sure that our medication list is the same as yours.   

## 2022-09-20 NOTE — Progress Notes (Signed)
    Established patient visit   Patient: Jonathan E Mcfall Jr.   DOB: 07/29/1976   46 y.o. Male  MRN: [id number] Visit Date: 09/20/2022  Today's healthcare provider: Donald Fisher, MD   Chief Complaint  Patient presents with   Hypertension   Subjective    HPI  Hypertension, follow-up  BP Readings from Last 3 Encounters:  09/20/22 136/76  06/15/22 129/75  05/04/22 (!) 145/93   Wt Readings from Last 3 Encounters:  09/20/22 215 lb (97.5 kg)  06/15/22 215 lb 14.4 oz (97.9 kg)  05/04/22 217 lb (98.4 kg)     He was last seen for hypertension 3 months ago.  BP at that visit was 129/75. Management since that visit includes continue amlodipine.  He reports good compliance with treatment. He is not having side effects.  He is following a Regular diet. He is not exercising. He does not smoke.  Use of agents associated with hypertension: none.   Outside blood pressures are consistently in the 150s-160s with automatic brachial cuff.  Symptoms: No chest pain No chest pressure  No palpitations No syncope  No dyspnea No orthopnea  No paroxysmal nocturnal dyspnea No lower extremity edema   Pertinent labs Lab Results  Component Value Date   CHOL 181 05/04/2022   HDL 51 05/04/2022   LDLCALC 111 (H) 05/04/2022   TRIG 102 05/04/2022   CHOLHDL 3.5 05/04/2022   Lab Results  Component Value Date   NA 140 05/04/2022   K 4.4 05/04/2022   CREATININE 1.04 05/04/2022   EGFR 89 05/04/2022   GLUCOSE 109 (H) 05/04/2022   TSH 1.010 07/30/2020     The 10-year ASCVD risk score (Arnett DK, et al., 2019) is: 8.6%  ---------------------------------------------------------------------------------------------------  He also states he never filled prescription for 10mg daily Cialis due to insurance copay and wants to go back to 20mg prn.   He also reports having pain in his lower back when he gets up in the morning, eases up after he gets up and round. No radiation. No known injury, has  been going on for 2-3 weeks.    Medications: Outpatient Medications Prior to Visit  Medication Sig Note   ACETAMINOPHEN EXTRA STRENGTH 500 MG tablet TAKE 1 TABLET BY MOUTH EVERY 6 HOURS AS NEEDED    amLODipine (NORVASC) 5 MG tablet TAKE 1 TABLET (5 MG TOTAL) BY MOUTH DAILY.    diclofenac (VOLTAREN) 75 MG EC tablet TAKE 1 TABLET BY MOUTH TWICE A DAY    famotidine (PEPCID) 20 MG tablet Take 1 tablet (20 mg total) by mouth 2 (two) times daily.    fluticasone (FLONASE) 50 MCG/ACT nasal spray SPRAY 2 SPRAYS INTO EACH NOSTRIL EVERY DAY    HIBICLENS 4 % external liquid APPLY TO AFFECTED AREA EVERY DAY AS NEEDED    meloxicam (MOBIC) 15 MG tablet Take 1 tablet (15 mg total) by mouth daily.    tadalafil (CIALIS) 10 MG tablet Take 1 tablet (10 mg total) by mouth daily. (Patient not taking: Reported on 09/20/2022) 09/20/2022: Did not fill prescription due to insurance coverage   No facility-administered medications prior to visit.    Review of Systems     Objective    BP 136/76 (BP Location: Left Arm, Patient Position: Sitting, Cuff Size: Large)   Pulse 60   Temp 98 F (36.7 C) (Oral)   Resp 14   Wt 215 lb (97.5 kg)   SpO2 100% Comment: room air  BMI 32.69 kg/m         Assessment & Plan     1. Primary hypertension Home BP consistently above goal. Will double dose of  amLODipine (NORVASC) 5 MG tablet; to Take 2 tablets (10 mg total) by mouth daily.  He just had 90 of the 5mg tabs dispensed, will contact him in a few weeks and change to 10mg tablets if tolerating.   Future Appointments  Date Time Provider Department Center  03/23/2023  8:00 AM Fisher, Donald E, MD [facility]    2. Acute low back pain without sciatica, unspecified back pain laterality Recent onset, mainly in the mornings with no radicular symptoms.   - cyclobenzaprine (FLEXERIL) 10 MG tablet; Take 1 tablet (10 mg total) by mouth at bedtime as needed for muscle spasms.  Dispense: 30 tablet; Refill: 0  3. Erectile  dysfunction, unspecified erectile dysfunction type He prefers to go back to 20mg prn due to insurance copay.   - tadalafil (CIALIS) 20 MG tablet; Take 1 tablet (20 mg total) by mouth every other day as needed for erectile dysfunction.  Dispense: 6 tablet; Refill: 3  4. Prostate cancer screening Recent had PSA of 2.6 which urologist felt was high for his age. ULN generally considered to be 2.5 for men in their 40s. His PSA has been consistently in the upper end of  age-specific reference range since he started having PSA checked in 2014. Will plan on rechecking with free PSA in 5-6 months.      The entirety of the information documented in the History of Present Illness, Review of Systems and Physical Exam were personally obtained by me. Portions of this information were initially documented by the CMA and reviewed by me for thoroughness and accuracy.     Donald Fisher, MD  [facility] [phone] (phone) [phone] (fax)  [facility]  

## 2022-09-29 ENCOUNTER — Other Ambulatory Visit: Payer: Self-pay | Admitting: Family Medicine

## 2022-09-29 DIAGNOSIS — N529 Male erectile dysfunction, unspecified: Secondary | ICD-10-CM

## 2022-09-30 ENCOUNTER — Telehealth: Payer: Self-pay | Admitting: Family Medicine

## 2022-09-30 DIAGNOSIS — N529 Male erectile dysfunction, unspecified: Secondary | ICD-10-CM

## 2022-09-30 NOTE — Telephone Encounter (Signed)
Unable to refill per protocol, last refill by  provider 09/20/22. Will refuse duplicate request.  Requested Prescriptions  Pending Prescriptions Disp Refills   tadalafil (CIALIS) 20 MG tablet [Pharmacy Med Name: TADALAFIL 20 MG TABLET] 6 tablet 5    Sig: TAKE 1 TABLET (20 MG TOTAL) BY MOUTH EVERY OTHER DAY AS NEEDED FOR ERECTILE DYSFUNCTION.     Urology: Erectile Dysfunction Agents Passed - 09/29/2022  8:09 PM      Passed - AST in normal range and within 360 days    AST  Date Value Ref Range Status  05/04/2022 20 0 - 40 IU/L Final         Passed - ALT in normal range and within 360 days    ALT  Date Value Ref Range Status  05/04/2022 24 0 - 44 IU/L Final         Passed - Last BP in normal range    BP Readings from Last 1 Encounters:  09/20/22 136/76         Passed - Valid encounter within last 12 months    Recent Outpatient Visits           1 week ago Primary hypertension   [facility] Birdie Sons, MD   3 months ago Primary hypertension   [facility] Birdie Sons, MD   4 months ago Primary hypertension   [facility] Birdie Sons, MD   8 months ago Primary hypertension   [facility] Birdie Sons, MD   1 year ago Primary hypertension   [facility] Birdie Sons, MD       Future Appointments             In 5 months Fisher, Kirstie Peri, MD [facility]

## 2022-09-30 NOTE — Telephone Encounter (Signed)
Refilled 09/20/2022 #6 3 rf. Requested Prescriptions  Pending Prescriptions Disp Refills   tadalafil (CIALIS) 20 MG tablet [Pharmacy Med Name: TADALAFIL 20 MG TABLET] 6 tablet 5    Sig: TAKE 1 TABLET (20 MG TOTAL) BY MOUTH EVERY OTHER DAY AS NEEDED FOR ERECTILE DYSFUNCTION.     Urology: Erectile Dysfunction Agents Passed - 09/30/2022  4:50 PM      Passed - AST in normal range and within 360 days    AST  Date Value Ref Range Status  05/04/2022 20 0 - 40 IU/L Final         Passed - ALT in normal range and within 360 days    ALT  Date Value Ref Range Status  05/04/2022 24 0 - 44 IU/L Final         Passed - Last BP in normal range    BP Readings from Last 1 Encounters:  09/20/22 136/76         Passed - Valid encounter within last 12 months    Recent Outpatient Visits           1 week ago Primary hypertension   [facility] Birdie Sons, MD   3 months ago Primary hypertension   [facility] Birdie Sons, MD   4 months ago Primary hypertension   [facility] Birdie Sons, MD   8 months ago Primary hypertension   [facility] Birdie Sons, MD   1 year ago Primary hypertension   [facility] Birdie Sons, MD       Future Appointments             In 5 months Fisher, Kirstie Peri, MD [facility], [place]

## 2022-10-01 MED ORDER — TADALAFIL 20 MG PO TABS: 20.0000 mg | ORAL_TABLET | ORAL | 3 refills | Status: DC | PRN

## 2022-10-01 NOTE — Addendum Note (Signed)
Addended by: Julieta Bellini on: 10/01/2022 09:21 AM   Modules accepted: Orders

## 2022-10-13 ENCOUNTER — Encounter: Payer: Self-pay | Admitting: Family Medicine

## 2022-10-15 ENCOUNTER — Other Ambulatory Visit: Payer: Self-pay | Admitting: Family Medicine

## 2022-10-15 DIAGNOSIS — I10 Essential (primary) hypertension: Secondary | ICD-10-CM

## 2022-10-15 MED ORDER — AMLODIPINE BESYLATE 10 MG PO TABS: 10.0000 mg | ORAL_TABLET | Freq: Every day | ORAL | 3 refills | Status: DC

## 2022-11-01 ENCOUNTER — Other Ambulatory Visit: Payer: Self-pay | Admitting: Family Medicine

## 2022-11-02 ENCOUNTER — Encounter: Payer: Self-pay | Admitting: Family Medicine

## 2022-11-02 ENCOUNTER — Telehealth (INDEPENDENT_AMBULATORY_CARE_PROVIDER_SITE_OTHER): Payer: BC Managed Care – PPO | Admitting: Family Medicine

## 2022-11-02 DIAGNOSIS — U071 COVID-19: Secondary | ICD-10-CM | POA: Insufficient documentation

## 2022-11-02 MED ORDER — NIRMATRELVIR/RITONAVIR (PAXLOVID)TABLET: 3.0000 | ORAL_TABLET | Freq: Two times a day (BID) | ORAL | 0 refills | Status: AC

## 2022-11-02 NOTE — Progress Notes (Signed)
Subjective:    SUBJECTIVE:  Jonathan Harren. is a 47 y.o. male who complains of congestion, productive cough, and bilateral sinus pain for 1 day. He denies a history of cough and sputum production and denies a history of asthma. Patient denies smoke cigarettes.  Worst symptom: sinus pain/drainage, cough Fever: no Cough: yes Shortness of breath: no Wheezing: no Chest pain: no Chest tightness: no Chest congestion: no Nasal congestion: yes Runny nose: no Post nasal drip: no Sneezing: yes Sore throat: no Swollen glands: no Sinus pressure: yes Headache: yes Face pain: no Toothache: no Ear pain: no  Ear pressure: no  Eyes red/itching:no Eye drainage/crusting: no  Vomiting: no Rash: no Fatigue: yes Sick contacts: no Strep contacts: no  Context: worse Recurrent sinusitis: no Relief with OTC cold/cough medications: no  Treatments attempted: cold/sinus, mucinex, and cough syrup   ROS  Social History:  Social History   Socioeconomic History   Marital status: Married    Spouse name: Not on file   Number of children: 3   Years of education: Not on file   Highest education level: Not on file  Occupational History   Occupation: Merchant navy officer  Tobacco Use   Smoking status: Never   Smokeless tobacco: Never  Vaping Use   Vaping Use: Never used  Substance and Sexual Activity   Alcohol use: No   Drug use: No    Comment: History of marijuana 10 yrs ago   Sexual activity: Not on file  Other Topics Concern   Not on file  Social History Narrative   Not on file   Social Determinants of Health   Financial Resource Strain: Not on file  Food Insecurity: Not on file  Transportation Needs: Not on file  Physical Activity: Not on file  Stress: Not on file  Social Connections: Not on file  Intimate Partner Violence: Not on file   Social History   Tobacco Use  Smoking Status Never  Smokeless Tobacco Never   Social History   Substance and Sexual Activity  Alcohol Use No     Family History  Problem Relation Age of Onset   Hypertension Mother    Hypertension Father    Hemochromatosis Father    Diabetes Maternal Aunt    Cancer Maternal Grandfather    Hemochromatosis Paternal Grandmother    Prostate cancer Paternal Grandfather    Colon cancer Neg Hx    Past Medical History:  Diagnosis Date   History of chicken pox 07/29/2015   DID have Chicken Pox.     Hypertension    Past Surgical History:  Procedure Laterality Date   COLONOSCOPY WITH PROPOFOL N/A 01/22/2022   Procedure: COLONOSCOPY WITH PROPOFOL;  Surgeon: Jonathon Bellows, MD;  Location: [facility] ENDOSCOPY;  Service: Gastroenterology;  Laterality: N/A;   HERNIA REPAIR     Inguinal; removal as a child   History of removal of Cyst  2010   on back   History of wisdom tooth extraction       Current Outpatient Medications on File Prior to Visit  Medication Sig Dispense Refill   ACETAMINOPHEN EXTRA STRENGTH 500 MG tablet TAKE 1 TABLET BY MOUTH EVERY 6 HOURS AS NEEDED 100 tablet 5   amLODipine (NORVASC) 10 MG tablet Take 1 tablet (10 mg total) by mouth daily. 90 tablet 3   diclofenac (VOLTAREN) 75 MG EC tablet TAKE 1 TABLET BY MOUTH TWICE A DAY 60 tablet 1   famotidine (PEPCID) 20 MG tablet Take 1 tablet (20 mg total)  by mouth 2 (two) times daily. 180 tablet 3   fluticasone (FLONASE) 50 MCG/ACT nasal spray SPRAY 2 SPRAYS INTO EACH NOSTRIL EVERY DAY 48 mL 2   HIBICLENS 4 % external liquid APPLY TO AFFECTED AREA EVERY DAY AS NEEDED 2832 mL 6   meloxicam (MOBIC) 15 MG tablet Take 1 tablet (15 mg total) by mouth daily. 30 tablet 1   tadalafil (CIALIS) 20 MG tablet Take 1 tablet (20 mg total) by mouth every other day as needed for erectile dysfunction. 6 tablet 3   No current facility-administered medications on file prior to visit.    Allergies  Allergen Reactions   Aspirin Swelling    facial   Ibuprofen      Objective:  Physical Exam Constitutional:      General: He is not in acute distress.     Appearance: He is obese. He is not ill-appearing.  HENT:     Head: Normocephalic and atraumatic.     Nose: Congestion present.  Pulmonary:     Effort: Pulmonary effort is normal.  Skin:    General: Skin is dry.  Neurological:     General: No focal deficit present.     Mental Status: He is alert and oriented to person, place, and time.    Assessment/Plan: Problem List Items Addressed This Visit       Other   COVID-19 - Primary    COVID + 11/01/22 Similar symptoms to previous COVID infections- sinus drainage, cough, occasional sneezing Discussed 5 days of quarantine and 5 days of masking once return to normal ADLs Work note provided      Relevant Medications   nirmatrelvir/ritonavir (PAXLOVID) 20 x 150 MG & 10 x '100MG'$  TABS   Meds ordered this encounter  Medications   nirmatrelvir/ritonavir (PAXLOVID) 20 x 150 MG & 10 x '100MG'$  TABS    Sig: Take 3 tablets by mouth 2 (two) times daily for 5 days. (Take nirmatrelvir 150 mg two tablets twice daily for 5 days and ritonavir 100 mg one tablet twice daily for 5 days) Patient GFR is 89    Dispense:  30 tablet    Refill:  0   Follow-up: Return if symptoms worsen or fail to improve.  Vonna Kotyk, FNP, have reviewed all documentation for this visit. The documentation on 11/02/22 for the exam, diagnosis, procedures, and orders are all accurate and complete.

## 2022-11-02 NOTE — Assessment & Plan Note (Signed)
COVID + 11/01/22 Similar symptoms to previous COVID infections- sinus drainage, cough, occasional sneezing Discussed 5 days of quarantine and 5 days of masking once return to normal ADLs Work note provided

## 2022-11-15 ENCOUNTER — Encounter: Payer: Self-pay | Admitting: Family Medicine

## 2022-11-17 MED ORDER — TADALAFIL 10 MG PO TABS: 10.0000 mg | ORAL_TABLET | Freq: Every day | ORAL | 5 refills | Status: DC

## 2022-11-22 DIAGNOSIS — M5442 Lumbago with sciatica, left side: Secondary | ICD-10-CM | POA: Diagnosis not present

## 2022-11-22 DIAGNOSIS — M9903 Segmental and somatic dysfunction of lumbar region: Secondary | ICD-10-CM | POA: Diagnosis not present

## 2022-11-22 DIAGNOSIS — M9904 Segmental and somatic dysfunction of sacral region: Secondary | ICD-10-CM | POA: Diagnosis not present

## 2022-11-22 DIAGNOSIS — M7918 Myalgia, other site: Secondary | ICD-10-CM | POA: Diagnosis not present

## 2022-11-23 DIAGNOSIS — M5442 Lumbago with sciatica, left side: Secondary | ICD-10-CM | POA: Diagnosis not present

## 2022-11-23 DIAGNOSIS — M9904 Segmental and somatic dysfunction of sacral region: Secondary | ICD-10-CM | POA: Diagnosis not present

## 2022-11-23 DIAGNOSIS — M9903 Segmental and somatic dysfunction of lumbar region: Secondary | ICD-10-CM | POA: Diagnosis not present

## 2022-11-23 DIAGNOSIS — M7918 Myalgia, other site: Secondary | ICD-10-CM | POA: Diagnosis not present

## 2022-11-24 DIAGNOSIS — M5442 Lumbago with sciatica, left side: Secondary | ICD-10-CM | POA: Diagnosis not present

## 2022-11-24 DIAGNOSIS — M9903 Segmental and somatic dysfunction of lumbar region: Secondary | ICD-10-CM | POA: Diagnosis not present

## 2022-11-24 DIAGNOSIS — M7918 Myalgia, other site: Secondary | ICD-10-CM | POA: Diagnosis not present

## 2022-11-24 DIAGNOSIS — M9904 Segmental and somatic dysfunction of sacral region: Secondary | ICD-10-CM | POA: Diagnosis not present

## 2022-11-25 DIAGNOSIS — M9904 Segmental and somatic dysfunction of sacral region: Secondary | ICD-10-CM | POA: Diagnosis not present

## 2022-11-25 DIAGNOSIS — M9903 Segmental and somatic dysfunction of lumbar region: Secondary | ICD-10-CM | POA: Diagnosis not present

## 2022-11-25 DIAGNOSIS — M7918 Myalgia, other site: Secondary | ICD-10-CM | POA: Diagnosis not present

## 2022-11-25 DIAGNOSIS — M5442 Lumbago with sciatica, left side: Secondary | ICD-10-CM | POA: Diagnosis not present

## 2022-11-29 DIAGNOSIS — L730 Acne keloid: Secondary | ICD-10-CM | POA: Diagnosis not present

## 2022-11-29 DIAGNOSIS — M9904 Segmental and somatic dysfunction of sacral region: Secondary | ICD-10-CM | POA: Diagnosis not present

## 2022-11-29 DIAGNOSIS — M7918 Myalgia, other site: Secondary | ICD-10-CM | POA: Diagnosis not present

## 2022-11-29 DIAGNOSIS — M5442 Lumbago with sciatica, left side: Secondary | ICD-10-CM | POA: Diagnosis not present

## 2022-11-29 DIAGNOSIS — M9903 Segmental and somatic dysfunction of lumbar region: Secondary | ICD-10-CM | POA: Diagnosis not present

## 2022-12-01 DIAGNOSIS — M9903 Segmental and somatic dysfunction of lumbar region: Secondary | ICD-10-CM | POA: Diagnosis not present

## 2022-12-01 DIAGNOSIS — M9904 Segmental and somatic dysfunction of sacral region: Secondary | ICD-10-CM | POA: Diagnosis not present

## 2022-12-01 DIAGNOSIS — M5442 Lumbago with sciatica, left side: Secondary | ICD-10-CM | POA: Diagnosis not present

## 2022-12-01 DIAGNOSIS — M7918 Myalgia, other site: Secondary | ICD-10-CM | POA: Diagnosis not present

## 2022-12-02 DIAGNOSIS — M9903 Segmental and somatic dysfunction of lumbar region: Secondary | ICD-10-CM | POA: Diagnosis not present

## 2022-12-02 DIAGNOSIS — M9904 Segmental and somatic dysfunction of sacral region: Secondary | ICD-10-CM | POA: Diagnosis not present

## 2022-12-02 DIAGNOSIS — M7918 Myalgia, other site: Secondary | ICD-10-CM | POA: Diagnosis not present

## 2022-12-02 DIAGNOSIS — M5442 Lumbago with sciatica, left side: Secondary | ICD-10-CM | POA: Diagnosis not present

## 2022-12-07 DIAGNOSIS — M9904 Segmental and somatic dysfunction of sacral region: Secondary | ICD-10-CM | POA: Diagnosis not present

## 2022-12-07 DIAGNOSIS — M9903 Segmental and somatic dysfunction of lumbar region: Secondary | ICD-10-CM | POA: Diagnosis not present

## 2022-12-07 DIAGNOSIS — M5442 Lumbago with sciatica, left side: Secondary | ICD-10-CM | POA: Diagnosis not present

## 2022-12-07 DIAGNOSIS — M7918 Myalgia, other site: Secondary | ICD-10-CM | POA: Diagnosis not present

## 2022-12-08 DIAGNOSIS — M9904 Segmental and somatic dysfunction of sacral region: Secondary | ICD-10-CM | POA: Diagnosis not present

## 2022-12-08 DIAGNOSIS — M7918 Myalgia, other site: Secondary | ICD-10-CM | POA: Diagnosis not present

## 2022-12-08 DIAGNOSIS — M9903 Segmental and somatic dysfunction of lumbar region: Secondary | ICD-10-CM | POA: Diagnosis not present

## 2022-12-08 DIAGNOSIS — M5442 Lumbago with sciatica, left side: Secondary | ICD-10-CM | POA: Diagnosis not present

## 2022-12-09 DIAGNOSIS — M7918 Myalgia, other site: Secondary | ICD-10-CM | POA: Diagnosis not present

## 2022-12-09 DIAGNOSIS — M9903 Segmental and somatic dysfunction of lumbar region: Secondary | ICD-10-CM | POA: Diagnosis not present

## 2022-12-09 DIAGNOSIS — M9904 Segmental and somatic dysfunction of sacral region: Secondary | ICD-10-CM | POA: Diagnosis not present

## 2022-12-09 DIAGNOSIS — M5442 Lumbago with sciatica, left side: Secondary | ICD-10-CM | POA: Diagnosis not present

## 2022-12-10 DIAGNOSIS — M5442 Lumbago with sciatica, left side: Secondary | ICD-10-CM | POA: Diagnosis not present

## 2022-12-10 DIAGNOSIS — M7918 Myalgia, other site: Secondary | ICD-10-CM | POA: Diagnosis not present

## 2022-12-10 DIAGNOSIS — M9903 Segmental and somatic dysfunction of lumbar region: Secondary | ICD-10-CM | POA: Diagnosis not present

## 2022-12-10 DIAGNOSIS — M9904 Segmental and somatic dysfunction of sacral region: Secondary | ICD-10-CM | POA: Diagnosis not present

## 2022-12-13 DIAGNOSIS — M9904 Segmental and somatic dysfunction of sacral region: Secondary | ICD-10-CM | POA: Diagnosis not present

## 2022-12-13 DIAGNOSIS — M7918 Myalgia, other site: Secondary | ICD-10-CM | POA: Diagnosis not present

## 2022-12-13 DIAGNOSIS — M9903 Segmental and somatic dysfunction of lumbar region: Secondary | ICD-10-CM | POA: Diagnosis not present

## 2022-12-13 DIAGNOSIS — M5442 Lumbago with sciatica, left side: Secondary | ICD-10-CM | POA: Diagnosis not present

## 2022-12-15 DIAGNOSIS — M9904 Segmental and somatic dysfunction of sacral region: Secondary | ICD-10-CM | POA: Diagnosis not present

## 2022-12-15 DIAGNOSIS — M9903 Segmental and somatic dysfunction of lumbar region: Secondary | ICD-10-CM | POA: Diagnosis not present

## 2022-12-15 DIAGNOSIS — M7918 Myalgia, other site: Secondary | ICD-10-CM | POA: Diagnosis not present

## 2022-12-15 DIAGNOSIS — M5442 Lumbago with sciatica, left side: Secondary | ICD-10-CM | POA: Diagnosis not present

## 2022-12-16 DIAGNOSIS — M7918 Myalgia, other site: Secondary | ICD-10-CM | POA: Diagnosis not present

## 2022-12-16 DIAGNOSIS — M9903 Segmental and somatic dysfunction of lumbar region: Secondary | ICD-10-CM | POA: Diagnosis not present

## 2022-12-16 DIAGNOSIS — M9904 Segmental and somatic dysfunction of sacral region: Secondary | ICD-10-CM | POA: Diagnosis not present

## 2022-12-16 DIAGNOSIS — M5442 Lumbago with sciatica, left side: Secondary | ICD-10-CM | POA: Diagnosis not present

## 2023-01-22 ENCOUNTER — Other Ambulatory Visit: Payer: Self-pay | Admitting: Family Medicine

## 2023-01-22 DIAGNOSIS — N529 Male erectile dysfunction, unspecified: Secondary | ICD-10-CM

## 2023-03-11 DIAGNOSIS — M9904 Segmental and somatic dysfunction of sacral region: Secondary | ICD-10-CM | POA: Diagnosis not present

## 2023-03-11 DIAGNOSIS — M5442 Lumbago with sciatica, left side: Secondary | ICD-10-CM | POA: Diagnosis not present

## 2023-03-11 DIAGNOSIS — M9903 Segmental and somatic dysfunction of lumbar region: Secondary | ICD-10-CM | POA: Diagnosis not present

## 2023-03-11 DIAGNOSIS — M7918 Myalgia, other site: Secondary | ICD-10-CM | POA: Diagnosis not present

## 2023-03-22 NOTE — Progress Notes (Deleted)
Established patient visit   Patient: Jonathan Berg.   DOB: 04-30-1976   46 y.o. Male  MRN: [id number] Visit Date: 03/23/2023  Today's healthcare provider: Mila Merry, MD   No chief complaint on file.  Subjective    HPI  Hypertension, follow-up  BP Readings from Last 3 Encounters:  09/20/22 136/76  06/15/22 129/75  05/04/22 (!) 145/93   Wt Readings from Last 3 Encounters:  09/20/22 215 lb (97.5 kg)  06/15/22 215 lb 14.4 oz (97.9 kg)  05/04/22 217 lb (98.4 kg)     He was last seen for hypertension 6 months ago.  BP at that visit was 136/76. Management since that visit includes amlodipine change from 5 mg to 10 mg.  He reports {excellent/good/fair/poor:19665} compliance with treatment. He {is/is not:9024} having side effects. {document side effects if present:1}  Outside blood pressures are {***enter patient reported home BP readings, or 'not being checked':1}. Symptoms: {Yes/No:20286} chest pain {Yes/No:20286} chest pressure  {Yes/No:20286} palpitations {Yes/No:20286} syncope  {Yes/No:20286} dyspnea {Yes/No:20286} orthopnea  {Yes/No:20286} paroxysmal nocturnal dyspnea {Yes/No:20286} lower extremity edema   Pertinent labs Lab Results  Component Value Date   CHOL 181 05/04/2022   HDL 51 05/04/2022   LDLCALC 111 (H) 05/04/2022   TRIG 102 05/04/2022   CHOLHDL 3.5 05/04/2022   Lab Results  Component Value Date   NA 140 05/04/2022   K 4.4 05/04/2022   CREATININE 1.04 05/04/2022   EGFR 89 05/04/2022   GLUCOSE 109 (H) 05/04/2022   TSH 1.010 07/30/2020     The 10-year ASCVD risk score (Arnett DK, et al., 2019) is: 8.6%  ---------------------------------------------------------------------------------------------------  Follow up for elevated psa  The patient was last seen for this 6 months ago. Changes made at last visit include recheck levels. Lab Results  Component Value Date   PSA1 2.6 09/06/2022   PSA1 2.9 08/03/2021   PSA1 2.2 07/30/2020    PSA 1.7 09/10/2016   PSA 1.4 07/29/2015   PSA 1.5 11/21/2012     -----------------------------------------------------------------------------------------  Medications: Outpatient Medications Prior to Visit  Medication Sig   ACETAMINOPHEN EXTRA STRENGTH 500 MG tablet TAKE 1 TABLET BY MOUTH EVERY 6 HOURS AS NEEDED   amLODipine (NORVASC) 10 MG tablet Take 1 tablet (10 mg total) by mouth daily.   diclofenac (VOLTAREN) 75 MG EC tablet TAKE 1 TABLET BY MOUTH TWICE A DAY   famotidine (PEPCID) 20 MG tablet Take 1 tablet (20 mg total) by mouth 2 (two) times daily.   fluticasone (FLONASE) 50 MCG/ACT nasal spray SPRAY 2 SPRAYS INTO EACH NOSTRIL EVERY DAY   HIBICLENS 4 % external liquid APPLY TO AFFECTED AREA EVERY DAY AS NEEDED   meloxicam (MOBIC) 15 MG tablet Take 1 tablet (15 mg total) by mouth daily.   tadalafil (CIALIS) 10 MG tablet Take 1 tablet (10 mg total) by mouth daily.   tadalafil (CIALIS) 20 MG tablet TAKE 1 TABLET (20 MG TOTAL) BY MOUTH EVERY OTHER DAY AS NEEDED FOR ERECTILE DYSFUNCTION.   No facility-administered medications prior to visit.    Review of Systems  {Labs  Heme  Chem  Endocrine  Serology  Results Review (optional):23779}   Objective    There were no vitals taken for this visit. {Show previous vital signs (optional):23777}  Physical Exam  ***  No results found for any visits on 03/23/23.  Assessment & Plan     ***  No follow-ups on file.      {provider attestation***:1}   Mila Merry,  MD  [facility] [phone] (phone) [phone] (fax)  [facility]

## 2023-03-23 ENCOUNTER — Ambulatory Visit: Payer: BC Managed Care – PPO | Admitting: Family Medicine

## 2023-04-08 ENCOUNTER — Ambulatory Visit: Payer: BC Managed Care – PPO | Admitting: Family Medicine

## 2023-05-11 ENCOUNTER — Ambulatory Visit: Payer: BC Managed Care – PPO | Admitting: Family Medicine

## 2023-05-27 ENCOUNTER — Ambulatory Visit: Payer: BC Managed Care – PPO | Admitting: Family Medicine

## 2023-05-27 ENCOUNTER — Encounter: Payer: Self-pay | Admitting: Family Medicine

## 2023-05-27 VITALS — BP 122/66 | HR 80 | Temp 98.2°F | Resp 12 | Ht 68.0 in | Wt 215.0 lb

## 2023-05-27 DIAGNOSIS — N529 Male erectile dysfunction, unspecified: Secondary | ICD-10-CM | POA: Diagnosis not present

## 2023-05-27 DIAGNOSIS — I10 Essential (primary) hypertension: Secondary | ICD-10-CM

## 2023-05-27 MED ORDER — TADALAFIL 20 MG PO TABS
20.0000 mg | ORAL_TABLET | ORAL | 11 refills | Status: DC | PRN
Start: 2023-05-27 — End: 2024-07-17

## 2023-05-27 NOTE — Progress Notes (Signed)
      Established patient visit   Patient: Jonathan Berg.   DOB: 05-26-1976   47 y.o. Male  MRN: [id number] Visit Date: 05/27/2023  Today's healthcare provider: Mila Merry, MD   Chief Complaint  Patient presents with   Hypertension   Subjective    HPI HPI   09/20/22 increased amlodipine to 10 mg daily. Patient reports good compliance and tolerance of medications. He reports his Bp at home are elevated. He is not sure the machine is working. Advised to bring in next time so we can check.  Last edited by Myles Lipps, CMA on 05/27/2023  9:38 AM.        Medications: Outpatient Medications Prior to Visit  Medication Sig Note   ACETAMINOPHEN EXTRA STRENGTH 500 MG tablet TAKE 1 TABLET BY MOUTH EVERY 6 HOURS AS NEEDED    amLODipine (NORVASC) 10 MG tablet Take 1 tablet (10 mg total) by mouth daily.    diclofenac (VOLTAREN) 75 MG EC tablet TAKE 1 TABLET BY MOUTH TWICE A DAY    famotidine (PEPCID) 20 MG tablet Take 1 tablet (20 mg total) by mouth 2 (two) times daily.    fluticasone (FLONASE) 50 MCG/ACT nasal spray SPRAY 2 SPRAYS INTO EACH NOSTRIL EVERY DAY    HIBICLENS 4 % external liquid APPLY TO AFFECTED AREA EVERY DAY AS NEEDED    meloxicam (MOBIC) 15 MG tablet Take 1 tablet (15 mg total) by mouth daily.    [DISCONTINUED] tadalafil (CIALIS) 20 MG tablet TAKE 1 TABLET (20 MG TOTAL) BY MOUTH EVERY OTHER DAY AS NEEDED FOR ERECTILE DYSFUNCTION.    [DISCONTINUED] tadalafil (CIALIS) 10 MG tablet Take 1 tablet (10 mg total) by mouth daily. (Patient not taking: Reported on 05/27/2023) 05/27/2023: on a different dose   No facility-administered medications prior to visit.      Objective    BP 122/66 (BP Location: Left Arm, Patient Position: Sitting, Cuff Size: Large)   Pulse 80   Temp 98.2 F (36.8 C) (Temporal)   Resp 12   Ht 5\' 8"  (1.727 m)   Wt 215 lb (97.5 kg)   SpO2 98%   BMI 32.69 kg/m    Physical Exam  General appearance: Mildly obese male, cooperative and in no  acute distress Head: Normocephalic, without obvious abnormality, atraumatic Respiratory: Respirations even and unlabored, normal respiratory rate Extremities: All extremities are intact.  Skin: Skin color, texture, turgor normal. No rashes seen  Psych: Appropriate mood and affect. Neurologic: Mental status: Alert, oriented to person, place, and time, thought content appropriate.    Assessment & Plan     1. Primary hypertension Well controlled.  Continue current medications. Home readings higher than office readings. He is going to bring in his BP cuff to his next appt to compare.   2. Erectile dysfunction, unspecified erectile dysfunction type  - tadalafil (CIALIS) 20 MG tablet; Take 1 tablet (20 mg total) by mouth every other day as needed for erectile dysfunction.  Dispense: 6 tablet; Refill: 11    Return in about 16 weeks (around 09/16/2023) for Yearly Physical.      The entirety of the information documented in the History of Present Illness, Review of Systems and Physical Exam were personally obtained by me. Portions of this information were initially documented by the CMA and reviewed by me for thoroughness and accuracy.     Mila Merry, MD  [facility] [phone] (phone) [phone] (fax)  [facility]

## 2023-05-27 NOTE — Patient Instructions (Signed)
.   Please review the attached list of medications and notify my office if there are any errors.   . Please bring all of your medications to every appointment so we can make sure that our medication list is the same as yours.   

## 2023-05-31 DIAGNOSIS — L730 Acne keloid: Secondary | ICD-10-CM | POA: Diagnosis not present

## 2023-07-07 ENCOUNTER — Ambulatory Visit: Payer: Self-pay

## 2023-07-07 NOTE — Telephone Encounter (Signed)
Summary: ankle swelling due to possible insect bite.   Pt stated on Saturday something bit him on his ankle and has some swelling, some pain, and it's red.  Seeking clinical advice.         Chief Complaint: Insect bite left ankle. Red,swollen, tender to touch. Symptoms: Above Frequency: Last weekend Pertinent Negatives: Patient denies  Disposition: [] ED /[] Urgent Care (no appt availability in office) / [x] Appointment(In office/virtual)/ []  [place] [facility]/ [] Home Care/ [] Refused Recommended Disposition /[] [place] [facility]/ []  Follow-up with PCP Additional Notes: Pt. Agrees with appointment.  Reason for Disposition  [1] Red or very tender (to touch) area AND [2] started over 24 hours after the bite  Answer Assessment - Initial Assessment Questions 1. TYPE of INSECT: "What type of insect was it?"      Unsure 2. ONSET: "When did you get bitten?"      Weekend 3. LOCATION: "Where is the insect bite located?"      Left ankle 4. REDNESS: "Is the area red or pink?" If Yes, ask: "What size is area of redness?" (inches or cm). "When did the redness start?"     Yes - dime size 5. PAIN: "Is there any pain?" If Yes, ask: "How bad is it?"  (Scale 1-10; or mild, moderate, severe)     Mild 6. ITCHING: "Does it itch?" If Yes, ask: "How bad is the itch?"    - MILD: doesn't interfere with normal activities   - MODERATE-SEVERE: interferes with work, school, sleep, or other activities      No 7. SWELLING: "How big is the swelling?" (inches, cm, or compare to coins)     Puffy 8. OTHER SYMPTOMS: "Do you have any other symptoms?"  (e.g., difficulty breathing, hives)     No 9. PREGNANCY: "Is there any chance you are pregnant?" "When was your last menstrual period?"     N/a  Protocols used: Insect Bite-A-AH

## 2023-07-08 ENCOUNTER — Ambulatory Visit: Payer: BC Managed Care – PPO | Admitting: Physician Assistant

## 2023-07-08 VITALS — BP 137/87 | HR 68 | Ht 67.0 in | Wt 209.5 lb

## 2023-07-08 DIAGNOSIS — R21 Rash and other nonspecific skin eruption: Secondary | ICD-10-CM

## 2023-07-08 DIAGNOSIS — R739 Hyperglycemia, unspecified: Secondary | ICD-10-CM | POA: Diagnosis not present

## 2023-07-08 MED ORDER — TRIAMCINOLONE ACETONIDE 0.5 % EX OINT
1.0000 | TOPICAL_OINTMENT | Freq: Two times a day (BID) | CUTANEOUS | 0 refills | Status: AC
Start: 2023-07-08 — End: ?

## 2023-07-08 NOTE — Progress Notes (Unsigned)
Established patient visit  Patient: Jonathan Berg.   DOB: 11/14/1975   46 y.o. Male  MRN: [id number] Visit Date: 07/08/2023  Today's healthcare provider: Debera Lat, PA-C   Chief Complaint  Patient presents with   Medical Management of Chronic Issues    Possible Insect bite on left ankle, Swelling started Saturday and last throughout the weekend, prior treatment is Neosporin   Subjective    HPI HPI     Medical Management of Chronic Issues    Additional comments: Possible Insect bite on left ankle, Swelling started Saturday and last throughout the weekend, prior treatment is Neosporin      Last edited by Rolly Salter, CMA on 07/08/2023  8:33 AM.      *** Discussed the use of AI scribe software for clinical note transcription with the patient, who gave verbal consent to proceed.  History of Present Illness               07/08/2023    8:36 AM 05/27/2023    9:38 AM 01/27/2022    8:16 AM  Depression screen PHQ 2/9  Decreased Interest 0 0 0  Down, Depressed, Hopeless 0 0 0  PHQ - 2 Score 0 0 0  Altered sleeping 0  0  Tired, decreased energy 0  0  Change in appetite 0  0  Feeling bad or failure about yourself  0  1  Trouble concentrating 0  0  Moving slowly or fidgety/restless 0  0  Suicidal thoughts 0  0  PHQ-9 Score 0  1  Difficult doing work/chores   Not difficult at all      07/08/2023    8:37 AM  GAD 7 : Generalized Anxiety Score  Nervous, Anxious, on Edge 0  Control/stop worrying 0  Worry too much - different things 1  Trouble relaxing 0  Restless 0  Easily annoyed or irritable 0  Afraid - awful might happen 0  Total GAD 7 Score 1    Medications: Outpatient Medications Prior to Visit  Medication Sig   ACETAMINOPHEN EXTRA STRENGTH 500 MG tablet TAKE 1 TABLET BY MOUTH EVERY 6 HOURS AS NEEDED   amLODipine (NORVASC) 10 MG tablet Take 1 tablet (10 mg total) by mouth daily.   famotidine (PEPCID) 20 MG tablet Take 1 tablet (20 mg total) by mouth 2  (two) times daily.   fluticasone (FLONASE) 50 MCG/ACT nasal spray SPRAY 2 SPRAYS INTO EACH NOSTRIL EVERY DAY   HIBICLENS 4 % external liquid APPLY TO AFFECTED AREA EVERY DAY AS NEEDED   meloxicam (MOBIC) 15 MG tablet Take 1 tablet (15 mg total) by mouth daily.   tadalafil (CIALIS) 20 MG tablet Take 1 tablet (20 mg total) by mouth every other day as needed for erectile dysfunction.   diclofenac (VOLTAREN) 75 MG EC tablet TAKE 1 TABLET BY MOUTH TWICE A DAY (Patient not taking: Reported on 07/08/2023)   No facility-administered medications prior to visit.    Review of Systems Except see HPI   {Insert previous labs (optional):23779} {See past labs  Heme  Chem  Endocrine  Serology  Results Review (optional):1}   Objective    BP 137/87 (BP Location: Left Arm, Patient Position: Sitting, Cuff Size: Large)   Pulse 68   Ht 5\' 7"  (1.702 m)   Wt 209 lb 8 oz (95 kg)   SpO2 98%   BMI 32.81 kg/m  {Insert last BP/Wt (optional):23777}{See vitals history (optional):1}   Physical Exam   No  results found for any visits on 07/08/23.  Assessment & Plan    *** Assessment and Plan              No follow-ups on file.      [facility]

## 2023-07-09 ENCOUNTER — Encounter: Payer: Self-pay | Admitting: Physician Assistant

## 2023-07-09 LAB — CBC WITH DIFFERENTIAL/PLATELET
Basophils Absolute: 0.1 10*3/uL (ref 0.0–0.2)
Basos: 1 %
EOS (ABSOLUTE): 0.2 10*3/uL (ref 0.0–0.4)
Eos: 3 %
Hematocrit: 48.2 % (ref 37.5–51.0)
Hemoglobin: 15.2 g/dL (ref 13.0–17.7)
Immature Grans (Abs): 0 10*3/uL (ref 0.0–0.1)
Immature Granulocytes: 0 %
Lymphocytes Absolute: 1.8 10*3/uL (ref 0.7–3.1)
Lymphs: 23 %
MCH: 28 pg (ref 26.6–33.0)
MCHC: 31.5 g/dL (ref 31.5–35.7)
MCV: 89 fL (ref 79–97)
Monocytes Absolute: 0.4 10*3/uL (ref 0.1–0.9)
Monocytes: 5 %
Neutrophils Absolute: 5.6 10*3/uL (ref 1.4–7.0)
Neutrophils: 68 %
Platelets: 296 10*3/uL (ref 150–450)
RBC: 5.42 x10E6/uL (ref 4.14–5.80)
RDW: 13.2 % (ref 11.6–15.4)
WBC: 8.2 10*3/uL (ref 3.4–10.8)

## 2023-07-09 LAB — BASIC METABOLIC PANEL
BUN/Creatinine Ratio: 21 — ABNORMAL HIGH (ref 9–20)
BUN: 19 mg/dL (ref 6–24)
CO2: 22 mmol/L (ref 20–29)
Calcium: 9.6 mg/dL (ref 8.7–10.2)
Chloride: 101 mmol/L (ref 96–106)
Creatinine, Ser: 0.91 mg/dL (ref 0.76–1.27)
Glucose: 100 mg/dL — ABNORMAL HIGH (ref 70–99)
Potassium: 5 mmol/L (ref 3.5–5.2)
Sodium: 140 mmol/L (ref 134–144)
eGFR: 104 mL/min/{1.73_m2} (ref >59)

## 2023-07-09 LAB — HEMOGLOBIN A1C
Est. average glucose Bld gHb Est-mCnc: 123 mg/dL
Hgb A1c MFr Bld: 5.9 % — ABNORMAL HIGH (ref 4.8–5.6)

## 2023-07-29 ENCOUNTER — Other Ambulatory Visit: Payer: Self-pay | Admitting: Family Medicine

## 2023-07-29 NOTE — Telephone Encounter (Signed)
Requested Prescriptions  Pending Prescriptions Disp Refills   famotidine (PEPCID) 20 MG tablet [Pharmacy Med Name: FAMOTIDINE 20 MG TABLET] 180 tablet 3    Sig: TAKE 1 TABLET BY MOUTH TWICE A DAY     Gastroenterology:  H2 Antagonists Passed - 07/29/2023  2:42 AM      Passed - Valid encounter within last 12 months    Recent Outpatient Visits           3 weeks ago Rash   [place] [facility] [place] [facility], [place], PA-C   2 months ago Primary hypertension   [place] [facility] Malva Limes, MD   8 months ago COVID-19   [facility] Jacky Kindle, FNP   10 months ago Primary hypertension   [place] [facility] Malva Limes, MD   1 year ago Primary hypertension   [place] [facility] Malva Limes, MD       Future Appointments             In 1 month Fisher, Demetrios Isaacs, MD [facility]

## 2023-09-19 ENCOUNTER — Ambulatory Visit (INDEPENDENT_AMBULATORY_CARE_PROVIDER_SITE_OTHER): Payer: BC Managed Care – PPO | Admitting: Family Medicine

## 2023-09-19 ENCOUNTER — Encounter: Payer: Self-pay | Admitting: Family Medicine

## 2023-09-19 VITALS — BP 137/82 | HR 69 | Resp 16 | Ht 68.0 in | Wt 207.0 lb

## 2023-09-19 DIAGNOSIS — H6123 Impacted cerumen, bilateral: Secondary | ICD-10-CM | POA: Diagnosis not present

## 2023-09-19 DIAGNOSIS — Z0000 Encounter for general adult medical examination without abnormal findings: Secondary | ICD-10-CM | POA: Diagnosis not present

## 2023-09-19 DIAGNOSIS — Z125 Encounter for screening for malignant neoplasm of prostate: Secondary | ICD-10-CM

## 2023-09-19 DIAGNOSIS — I10 Essential (primary) hypertension: Secondary | ICD-10-CM

## 2023-09-19 DIAGNOSIS — R739 Hyperglycemia, unspecified: Secondary | ICD-10-CM

## 2023-09-19 MED ORDER — FLUTICASONE PROPIONATE 50 MCG/ACT NA SUSP: 2.0000 | Freq: Every day | NASAL | 1 refills | Status: DC

## 2023-09-19 NOTE — Patient Instructions (Signed)
Please review the attached list of medications and notify my office if there are any errors.   Try over the counter Debrox ear drops to clear the wax from you ears

## 2023-09-19 NOTE — Progress Notes (Signed)
Established patient visit   Patient: Jonathan Berg.   DOB: May 22, 1976   47 y.o. Male  MRN: [id number] Visit Date: 09/19/2023  Today's healthcare provider: Mila Merry, MD   Chief Complaint  Patient presents with   Annual Exam   Subjective    Discussed the use of AI scribe software for clinical note transcription with the patient, who gave verbal consent to proceed.  History of Present Illness   The patient, with a history of prediabetes and hypertension, presents for a routine physical. He reports overall good health with no specific complaints. He had a recent concern about a knot on his ankle, which has since resolved. He has been managing his prediabetes with regular monitoring of his blood sugar levels. His hypertension is well-controlled with medication, and he has not experienced any side effects from his current regimen.  The patient also reports a history of earwax buildup, which has been causing some discomfort. He has been attempting to manage this at home but has not used any over-the-counter earwax drops. He also reports occasional nasal congestion, which he manages with a prescription nasal spray and occasional use of an over-the-counter decongestant.  The patient maintains an active lifestyle, including regular weightlifting and cardiovascular exercise. He has recently added a step routine to his exercise regimen. He has not reported any gastrointestinal issues, but he has noticed an increase in bowel movements, which he describes as regular but sometimes frequent. There is no associated pain or blood in the stool.     Lab Results  Component Value Date   HGBA1C 5.9 (H) 07/08/2023   Lab Results  Component Value Date   CHOL 181 05/04/2022   HDL 51 05/04/2022   LDLCALC 111 (H) 05/04/2022   TRIG 102 05/04/2022   CHOLHDL 3.5 05/04/2022   Lab Results  Component Value Date   NA 140 07/08/2023   K 5.0 07/08/2023   CREATININE 0.91 07/08/2023   EGFR 104  07/08/2023   GLUCOSE 100 (H) 07/08/2023     Medications: Outpatient Medications Prior to Visit  Medication Sig   ACETAMINOPHEN EXTRA STRENGTH 500 MG tablet TAKE 1 TABLET BY MOUTH EVERY 6 HOURS AS NEEDED   amLODipine (NORVASC) 10 MG tablet Take 1 tablet (10 mg total) by mouth daily.   famotidine (PEPCID) 20 MG tablet TAKE 1 TABLET BY MOUTH TWICE A DAY   HIBICLENS 4 % external liquid APPLY TO AFFECTED AREA EVERY DAY AS NEEDED   meloxicam (MOBIC) 15 MG tablet Take 1 tablet (15 mg total) by mouth daily.   tadalafil (CIALIS) 20 MG tablet Take 1 tablet (20 mg total) by mouth every other day as needed for erectile dysfunction.   triamcinolone ointment (KENALOG) 0.5 % Apply 1 Application topically 2 (two) times daily.   [DISCONTINUED] fluticasone (FLONASE) 50 MCG/ACT nasal spray SPRAY 2 SPRAYS INTO EACH NOSTRIL EVERY DAY   [DISCONTINUED] diclofenac (VOLTAREN) 75 MG EC tablet TAKE 1 TABLET BY MOUTH TWICE A DAY   No facility-administered medications prior to visit.   Review of Systems  Constitutional:  Negative for appetite change, chills and fever.  Respiratory:  Negative for chest tightness, shortness of breath and wheezing.   Cardiovascular:  Negative for chest pain and palpitations.  Gastrointestinal:  Negative for abdominal pain, nausea and vomiting.       Objective    BP 137/82 (BP Location: Left Arm, Patient Position: Sitting, Cuff Size: Large)   Pulse 69   Resp 16  Ht 5\' 8"  (1.727 m)   Wt 207 lb (93.9 kg)   BMI 31.47 kg/m   Physical Exam   General Appearance:    Overweight male. Alert, cooperative, in no acute distress, appears stated age  Head:    Normocephalic, without obvious abnormality, atraumatic  Eyes:    PERRL, conjunctiva/corneas clear, EOM's intact, fundi    benign, both eyes       Ears:    Both canals obstructed by cerumen  Nose:   Nares normal, septum midline, mucosa normal, no drainage   or sinus tenderness  Throat:   Lips, mucosa, and tongue normal; teeth  and gums normal  Neck:   Supple, symmetrical, trachea midline, no adenopathy;       thyroid:  No enlargement/tenderness/nodules; no carotid   bruit or JVD  Back:     Symmetric, no curvature, ROM normal, no CVA tenderness  Lungs:     Clear to auscultation bilaterally, respirations unlabored  Chest wall:    No tenderness or deformity  Heart:    Normal heart rate. Normal rhythm.  1/6 systolic murmur S1 and S2 normal  Abdomen:     Soft, non-tender, bowel sounds active all four quadrants,    no masses, no organomegaly  Genitalia:    deferred  Rectal:    deferred  Extremities:   All extremities are intact. No cyanosis or edema  Pulses:   2+ and symmetric all extremities  Skin:   Skin color, texture, turgor normal, no rashes or lesions  Lymph nodes:   Cervical, supraclavicular, and axillary nodes normal  Neurologic:   CNII-XII intact. Normal strength, sensation and reflexes      throughout     Assessment & Plan       General Health Maintenance Regular exercise routine including weightlifting and cardiovascular exercise. -Encourage continuation of current exercise routine.   Prediabetes Blood sugar levels in the prediabetes range. No current symptoms of diabetes. -Continue monitoring blood sugar levels once or twice a year.  Hypertension Blood pressure well-controlled on current medication. No reported side effects. -Continue current blood pressure medication.  Cerumen Impaction Bilateral cerumen impaction noted on physical exam. No reported hearing loss or tinnitus. -Recommend over-the-counter Debrox drops for cerumen softening and removal. Use in one ear at a time until clear before starting in the other ear.  Allergic Rhinitis Reports of nasal congestion and use of prescription nasal spray and occasional Afrin. -Refill prescription nasal spray. -Advise against frequent use of Afrin due to risk of rebound congestion.    Return in about 1 year (around 09/18/2024) for Yearly  Physical, Hypertension.      Mila Merry, MD  [facility] [phone] (phone) [phone] (fax)  [facility]

## 2023-09-20 ENCOUNTER — Encounter: Payer: Self-pay | Admitting: Family Medicine

## 2023-09-20 LAB — LIPID PANEL
Chol/HDL Ratio: 3.6 {ratio} (ref 0.0–5.0)
Cholesterol, Total: 178 mg/dL (ref 100–199)
HDL: 50 mg/dL (ref >39)
LDL Chol Calc (NIH): 107 mg/dL — ABNORMAL HIGH (ref 0–99)
Triglycerides: 119 mg/dL (ref 0–149)
VLDL Cholesterol Cal: 21 mg/dL (ref 5–40)

## 2023-09-20 LAB — PSA: Prostate Specific Ag, Serum: 3.3 ng/mL (ref 0.0–4.0)

## 2023-09-21 ENCOUNTER — Ambulatory Visit: Payer: BC Managed Care – PPO

## 2023-10-03 ENCOUNTER — Ambulatory Visit: Payer: Self-pay

## 2023-10-03 ENCOUNTER — Encounter: Payer: Self-pay | Admitting: Family Medicine

## 2023-10-03 ENCOUNTER — Telehealth (INDEPENDENT_AMBULATORY_CARE_PROVIDER_SITE_OTHER): Payer: BC Managed Care – PPO | Admitting: Family Medicine

## 2023-10-03 DIAGNOSIS — R739 Hyperglycemia, unspecified: Secondary | ICD-10-CM

## 2023-10-03 DIAGNOSIS — U071 COVID-19: Secondary | ICD-10-CM | POA: Diagnosis not present

## 2023-10-03 DIAGNOSIS — I10 Essential (primary) hypertension: Secondary | ICD-10-CM

## 2023-10-03 MED ORDER — NIRMATRELVIR/RITONAVIR (PAXLOVID)TABLET: 3.0000 | ORAL_TABLET | Freq: Two times a day (BID) | ORAL | 0 refills | Status: AC

## 2023-10-03 NOTE — Patient Instructions (Signed)
VISIT SUMMARY:  You visited today due to a recent positive COVID-19 test. You reported sinus symptoms and a mild cough but no severe symptoms like fever or loss of taste or smell. We discussed your current medications and overall health, including your hypertension and elevated glucose levels.  YOUR PLAN:  -COVID-19 INFECTION: You tested positive for COVID-19 and are experiencing mild symptoms such as sinus drainage and a mild cough. COVID-19 is a viral infection that can cause respiratory symptoms. To reduce the risk of severe illness, you have been prescribed Paxlovid, which you should take as three tablets by mouth twice a day for five days. You should isolate for five days and then wear a mask and clean commonly touched surfaces for an additional five days. Continue using an air purifier to improve ventilation and follow preventive measures like hand washing and surface cleaning. INSTRUCTIONS:  Please take Paxlovid as prescribed: three tablets by mouth twice a day for five days. Isolate for five days, then wear a mask and clean commonly touched surfaces for an additional five days. Continue taking your amlodipine 10 mg for hypertension and monitor your glucose levels as needed. A note for your work will be sent through [employer].

## 2023-10-03 NOTE — Progress Notes (Signed)
MyChart Video Visit    Virtual Visit via Video Note   This format is felt to be most appropriate for this patient at this time. Physical exam was limited by quality of the video and audio technology used for the visit.   Patient location: Patient's home address   Provider location: [facility]  [address], [address]  [place], [place] [address]   I discussed the limitations of evaluation and management by telemedicine and the availability of in person appointments. The patient expressed understanding and agreed to proceed.  Patient: Jonathan Berg.   DOB: Aug 18, 1976   47 y.o. Male  MRN: [id number] Visit Date: 10/03/2023  Today's healthcare provider: Ronnald Ramp, MD   No chief complaint on file.  Subjective    HPI   Discussed the use of AI scribe software for clinical note transcription with the patient, who gave verbal consent to proceed.  History of Present Illness   The patient, a 47 year old male with a history of hyperglycemia, erectile dysfunction, and hypertension managed with amlodipine 10mg , presents with a recent positive COVID-19 test. This is his second infection, having previously experienced COVID-19 symptoms earlier in the year and having received two COVID-19 vaccines in 2021.  The patient reports sinus symptoms and a mild cough, which prompted him to take a home COVID-19 test. He denies loss of taste or smell, fever, chills, body aches, or diarrhea at this time. The patient also denies a diagnosis of diabetes, despite a previous elevated glucose reading.  The patient works in an environment where he suspects exposure to a sick colleague may have occurred. He lives with others but reports no current illness among household members. The patient has access to disinfectant sprays and air purifiers at home for infection control.          Past Medical History:  Diagnosis Date   History of chicken pox 07/29/2015   DID have  Chicken Pox.     Hypertension     Medications: Outpatient Medications Prior to Visit  Medication Sig   ACETAMINOPHEN EXTRA STRENGTH 500 MG tablet TAKE 1 TABLET BY MOUTH EVERY 6 HOURS AS NEEDED   amLODipine (NORVASC) 10 MG tablet Take 1 tablet (10 mg total) by mouth daily.   famotidine (PEPCID) 20 MG tablet TAKE 1 TABLET BY MOUTH TWICE A DAY   fluticasone (FLONASE) 50 MCG/ACT nasal spray Place 2 sprays into both nostrils daily. SPRAY 2 SPRAYS INTO EACH NOSTRIL EVERY DAY   HIBICLENS 4 % external liquid APPLY TO AFFECTED AREA EVERY DAY AS NEEDED   meloxicam (MOBIC) 15 MG tablet Take 1 tablet (15 mg total) by mouth daily.   tadalafil (CIALIS) 20 MG tablet Take 1 tablet (20 mg total) by mouth every other day as needed for erectile dysfunction.   triamcinolone ointment (KENALOG) 0.5 % Apply 1 Application topically 2 (two) times daily.   No facility-administered medications prior to visit.    Review of Systems  Last metabolic panel Lab Results  Component Value Date   GLUCOSE 100 (H) 07/08/2023   NA 140 07/08/2023   K 5.0 07/08/2023   CL 101 07/08/2023   CO2 22 07/08/2023   BUN 19 07/08/2023   CREATININE 0.91 07/08/2023   EGFR 104 07/08/2023   CALCIUM 9.6 07/08/2023   PHOS 3.7 07/29/2015   PROT 7.3 05/04/2022   ALBUMIN 4.8 05/04/2022   LABGLOB 2.5 05/04/2022   AGRATIO 1.9 05/04/2022   BILITOT 0.3 05/04/2022   ALKPHOS 81 05/04/2022  AST 20 05/04/2022   ALT 24 05/04/2022        Objective    There were no vitals taken for this visit.  BP Readings from Last 3 Encounters:  09/19/23 137/82  07/08/23 137/87  05/27/23 122/66   Wt Readings from Last 3 Encounters:  09/19/23 207 lb (93.9 kg)  07/08/23 209 lb 8 oz (95 kg)  05/27/23 215 lb (97.5 kg)        Physical Exam Constitutional:      General: He is not in acute distress.    Appearance: Normal appearance. He is not ill-appearing or toxic-appearing.  Pulmonary:     Effort: Pulmonary effort is normal. No  respiratory distress.  Neurological:     Mental Status: He is alert.        Assessment & Plan     Problem List Items Addressed This Visit       Cardiovascular and Mediastinum   Primary hypertension   Relevant Medications   nirmatrelvir/ritonavir (PAXLOVID) 20 x 150 MG & 10 x 100MG  TABS     Other   COVID-19 - Primary   Relevant Medications   nirmatrelvir/ritonavir (PAXLOVID) 20 x 150 MG & 10 x 100MG  TABS       COVID-19 Infection Tested positive for COVID-19 approximately 1.5 hours ago. Symptoms include sinus drainage and mild cough. No loss of taste or smell, fever, chills, body aches, diarrhea, respiratory distress, or shortness of breath. Hypertension and elevated glucose levels noted. Kidney function normal (GFR 104, creatinine 0.91 in September). Discussed isolation and preventive measures. Paxlovid prescribed to reduce hospitalization and severe illness risk, conditions that raise concern for risk of severe illness include BMI >30 and HTN  - Prescribed Paxlovid: three tablets by mouth twice a day for five days - Advise isolation for five days, followed by masking and cleaning commonly touched surfaces for an additional five days - Recommend wearing a mask, washing hands, and cleaning commonly touched surfaces - Suggest using an air purifier to improve ventilation - Send a note through MyChart for work return after 5 days of in home isolation  Hypertension Well-managed with amlodipine 10 mg. - Continue amlodipine 10 mg  Hyperglycemia Elevated glucose levels, no diabetes diagnosis. - Monitor glucose levels as needed  General Health Maintenance Received two COVID vaccines in 2021. - Encourage continued adherence to COVID-19 preventive measures.         Return if symptoms worsen or fail to improve.     I discussed the assessment and treatment plan with the patient. The patient was provided an opportunity to ask questions and all were answered. The patient agreed  with the plan and demonstrated an understanding of the instructions.   The patient was advised to call back or seek an in-person evaluation if the symptoms worsen or if the condition fails to improve as anticipated.  I provided 10 minutes of non-face-to-face time during this encounter.   Ronnald Ramp, MD [facility] [phone] (phone) [phone] (fax)  [facility]

## 2023-10-03 NOTE — Telephone Encounter (Signed)
Summary: +Covid   +Covid,,,Sinus drainage, cough, .Marland Kitchen..patient would like to take a medication he was prescribed in the past (declined to make an appt)     Chief Complaint: COVID positive, sinus pain, drainage, scratchy throat. Symptoms: Above Frequency: Today Pertinent Negatives: Patient denies fever Disposition: [] ED /[] Urgent Care (no appt availability in office) / [x] Appointment(In office/virtual)/ []  [place] [facility]/ [] Home Care/ [] Refused Recommended Disposition /[] [place] [facility]/ []  Follow-up with PCP Additional Notes: Agrees with VV.  Reason for Disposition  MILD difficulty breathing (e.g., minimal/no SOB at rest, SOB with walking, pulse <100)  Answer Assessment - Initial Assessment Questions 1. COVID-19 DIAGNOSIS: "How do you know that you have COVID?" (e.g., positive lab test or self-test, diagnosed by doctor or NP/PA, symptoms after exposure).     Home test 2. COVID-19 EXPOSURE: "Was there any known exposure to COVID before the symptoms began?" CDC Definition of close contact: within 6 feet (2 meters) for a total of 15 minutes or more over a 24-hour period.      No 3. ONSET: "When did the COVID-19 symptoms start?"      Today 4. WORST SYMPTOM: "What is your worst symptom?" (e.g., cough, fever, shortness of breath, muscle aches)     Sinus pain 5. COUGH: "Do you have a cough?" If Yes, ask: "How bad is the cough?"       Yes 6. FEVER: "Do you have a fever?" If Yes, ask: "What is your temperature, how was it measured, and when did it start?"     No 7. RESPIRATORY STATUS: "Describe your breathing?" (e.g., normal; shortness of breath, wheezing, unable to speak)      Normal 8. BETTER-SAME-WORSE: "Are you getting better, staying the same or getting worse compared to yesterday?"  If getting worse, ask, "In what way?"     Same 9. OTHER SYMPTOMS: "Do you have any other symptoms?"  (e.g., chills, fatigue, headache, loss of smell or taste, muscle pain, sore throat)      Scratchy throat 10. HIGH RISK DISEASE: "Do you have any chronic medical problems?" (e.g., asthma, heart or lung disease, weak immune system, obesity, etc.)       No 11. VACCINE: "Have you had the COVID-19 vaccine?" If Yes, ask: "Which one, how many shots, when did you get it?"       N/a 12. PREGNANCY: "Is there any chance you are pregnant?" "When was your last menstrual period?"       N/a 13. O2 SATURATION MONITOR:  "Do you use an oxygen saturation monitor (pulse oximeter) at home?" If Yes, ask "What is your reading (oxygen level) today?" "What is your usual oxygen saturation reading?" (e.g., 95%)       No  Protocols used: Coronavirus (COVID-19) Diagnosed or Suspected-A-AH

## 2023-10-14 ENCOUNTER — Other Ambulatory Visit: Payer: Self-pay | Admitting: Family Medicine

## 2023-10-14 DIAGNOSIS — I10 Essential (primary) hypertension: Secondary | ICD-10-CM

## 2023-10-17 ENCOUNTER — Other Ambulatory Visit: Payer: Self-pay | Admitting: Family Medicine

## 2023-10-17 DIAGNOSIS — I10 Essential (primary) hypertension: Secondary | ICD-10-CM

## 2023-10-17 NOTE — Telephone Encounter (Signed)
Medication Refill -  Most Recent Primary Care Visit:  Provider: Ronnald Ramp  Department: [facility]  Visit Type: MYCHART VIDEO VISIT  Date: 10/03/2023  Medication: amLODipine (NORVASC) 10 MG tablet   Has the patient contacted their pharmacy? Yes  (Agent: If yes, when and what did the pharmacy advise?)  Is this the correct pharmacy for this prescription? Yes If no, delete pharmacy and type the correct one.  This is the patient's preferred pharmacy:  [facility] [address], [place] - [address] [place] [address] Phone: [phone] Fax: [phone]   Has the prescription been filled recently? Yes  Is the patient out of the medication? Yes  Has the patient been seen for an appointment in the last year OR does the patient have an upcoming appointment? Yes  Can we respond through MyChart? Yes  Agent: Please be advised that Rx refills may take up to 3 business days. We ask that you follow-up with your pharmacy.

## 2023-10-17 NOTE — Telephone Encounter (Signed)
Per OV- patient return in 1 year for hypertension Requested Prescriptions  Pending Prescriptions Disp Refills   amLODipine (NORVASC) 10 MG tablet [Pharmacy Med Name: AMLODIPINE BESYLATE 10 MG TAB] 90 tablet 3    Sig: TAKE 1 TABLET BY MOUTH EVERY DAY     Cardiovascular: Calcium Channel Blockers 2 Passed - 10/17/2023 12:27 PM      Passed - Last BP in normal range    BP Readings from Last 1 Encounters:  09/19/23 137/82         Passed - Last Heart Rate in normal range    Pulse Readings from Last 1 Encounters:  09/19/23 69         Passed - Valid encounter within last 6 months    Recent Outpatient Visits           2 weeks ago COVID-19   [place] [facility] Simmons-Robinson, [place], MD   4 weeks ago Annual physical exam   [facility] Malva Limes, MD   3 months ago Rash   [place] [facility] [place], [place], PA-C   4 months ago Primary hypertension   [facility] Malva Limes, MD   11 months ago COVID-19   [facility] Jacky Kindle, FNP       Future Appointments             In 11 months Fisher, Demetrios Isaacs, MD [facility]

## 2023-10-18 NOTE — Telephone Encounter (Signed)
Refilled 10/17/23. Requested Prescriptions  Refused Prescriptions Disp Refills   amLODipine (NORVASC) 10 MG tablet 90 tablet 3    Sig: Take 1 tablet (10 mg total) by mouth daily.     Cardiovascular: Calcium Channel Blockers 2 Passed - 10/18/2023 11:45 AM      Passed - Last BP in normal range    BP Readings from Last 1 Encounters:  09/19/23 137/82         Passed - Last Heart Rate in normal range    Pulse Readings from Last 1 Encounters:  09/19/23 69         Passed - Valid encounter within last 6 months    Recent Outpatient Visits           2 weeks ago COVID-19   [place] [facility] Simmons-Robinson, [place], MD   4 weeks ago Annual physical exam   [facility] Malva Limes, MD   3 months ago Rash   [place] [facility] [place], [place], PA-C   4 months ago Primary hypertension   [facility] Malva Limes, MD   11 months ago COVID-19   [facility] Jacky Kindle, FNP       Future Appointments             In 11 months Fisher, Demetrios Isaacs, MD [facility]

## 2023-11-03 DIAGNOSIS — M65342 Trigger finger, left ring finger: Secondary | ICD-10-CM | POA: Diagnosis not present

## 2023-11-03 DIAGNOSIS — M65352 Trigger finger, left little finger: Secondary | ICD-10-CM | POA: Diagnosis not present

## 2023-11-28 ENCOUNTER — Other Ambulatory Visit: Payer: Self-pay | Admitting: Family Medicine

## 2023-11-28 ENCOUNTER — Encounter: Payer: Self-pay | Admitting: Family Medicine

## 2023-11-28 DIAGNOSIS — R972 Elevated prostate specific antigen [PSA]: Secondary | ICD-10-CM

## 2024-01-04 ENCOUNTER — Other Ambulatory Visit: Payer: Self-pay | Admitting: Family Medicine

## 2024-02-06 ENCOUNTER — Other Ambulatory Visit: Payer: Self-pay | Admitting: Family Medicine

## 2024-02-06 NOTE — Telephone Encounter (Signed)
 Copied from CRM [id number]. Topic: Clinical - Medication Refill >> Feb 06, 2024  3:45 PM Rosamond Comes wrote: Patient requesting medication refill  Most Recent Primary Care Visit:  Provider: SIMMONS-ROBINSON, MAKIERA  Department: [id number]  Visit Type: MYCHART VIDEO VISIT  Date: 10/03/2023  Medication: fluticasone (FLONASE) 50 MCG/ACT nasal spray  Has the patient contacted their pharmacy? Yes (Agent: If no, request that the patient contact the pharmacy for the refill. If patient does not wish to contact the pharmacy document the reason why and proceed with request.) (Agent: If yes, when and what did the pharmacy advise?)  Is this the correct pharmacy for this prescription? No If no, delete pharmacy and type the correct one.  This is the patient's preferred pharmacy:   [facility] [address], [place] - [address] [place] [address] Phone: [phone] Fax: [phone]    Has the prescription been filled recently? No  Is the patient out of the medication? Yes  Has the patient been seen for an appointment in the last year OR does the patient have an upcoming appointment? Yes  Can we respond through MyChart? Yes  Agent: Please be advised that Rx refills may take up to 3 business days. We ask that you follow-up with your pharmacy.

## 2024-02-07 MED ORDER — FLUTICASONE PROPIONATE 50 MCG/ACT NA SUSP: 2.0000 | Freq: Every day | NASAL | 0 refills | Status: DC

## 2024-02-07 NOTE — Telephone Encounter (Signed)
 Requested Prescriptions  Pending Prescriptions Disp Refills   fluticasone (FLONASE) 50 MCG/ACT nasal spray 48 mL 0    Sig: Place 2 sprays into both nostrils daily. SPRAY 2 SPRAYS INTO EACH NOSTRIL EVERY DAY     Ear, Nose, and Throat: Nasal Preparations - Corticosteroids Failed - 02/07/2024  2:53 PM      Failed - Valid encounter within last 12 months    Recent Outpatient Visits   None     Future Appointments             In 7 months Fisher, Erlinda Haws, MD [facility]

## 2024-02-17 ENCOUNTER — Ambulatory Visit: Admitting: Family Medicine

## 2024-02-29 ENCOUNTER — Encounter (HOSPITAL_COMMUNITY): Payer: Self-pay

## 2024-03-07 ENCOUNTER — Ambulatory Visit: Admitting: Family Medicine

## 2024-03-28 ENCOUNTER — Ambulatory Visit: Admitting: Family Medicine

## 2024-04-09 ENCOUNTER — Ambulatory Visit: Admitting: Family Medicine

## 2024-05-07 ENCOUNTER — Ambulatory Visit: Admitting: Family Medicine

## 2024-06-08 ENCOUNTER — Ambulatory Visit: Admitting: Family Medicine

## 2024-07-11 ENCOUNTER — Encounter: Payer: Self-pay | Admitting: Family Medicine

## 2024-07-11 ENCOUNTER — Ambulatory Visit: Admitting: Family Medicine

## 2024-07-11 VITALS — BP 140/87 | HR 66 | Ht 68.0 in | Wt 206.2 lb

## 2024-07-11 DIAGNOSIS — I10 Essential (primary) hypertension: Secondary | ICD-10-CM | POA: Diagnosis not present

## 2024-07-11 DIAGNOSIS — R739 Hyperglycemia, unspecified: Secondary | ICD-10-CM

## 2024-07-11 MED ORDER — AMLODIPINE BESYLATE 10 MG PO TABS: 10.0000 mg | ORAL_TABLET | Freq: Every day | ORAL | 3 refills | Status: AC

## 2024-07-11 MED ORDER — HYDROCHLOROTHIAZIDE 12.5 MG PO TABS: 12.5000 mg | ORAL_TABLET | Freq: Every day | ORAL | 1 refills | Status: AC

## 2024-07-11 NOTE — Progress Notes (Signed)
 Established patient visit   Patient: Jonathan E Knoth Jr.   DOB: 09-17-1976   48 y.o. Male  MRN: [id number] Visit Date: 07/11/2024  Today's healthcare provider: Nancyann Perry, MD   Chief Complaint  Patient presents with   Medical Management of Chronic Issues    HTN-BP readings at home are upper 130-140/80-70   Subjective    Discussed the use of AI scribe software for clinical note transcription with the patient, who gave verbal consent to proceed.  History of Present Illness   Jonathan Berg is a 48 year old male with hypertension who presents for a follow-up on blood pressure management.  His blood pressure at home typically runs around 140/79 mmHg, sometimes reaching 140/80 or 135 mmHg. He is currently taking amlodipine  10 mg daily, which he has been on for a long time. Initially, he started on a 5 mg dose, which was increased to 10 mg after 6-7 months due to insufficient control. No side effects from the medication.  He uses Flonase  for nasal congestion, but sometimes still experiences significant nasal congestion and resorts to using Afrin nasal spray. However, he sometimes experiences significant nasal congestion, particularly if he misses a dose, and resorts to using Afrin nasal spray. He uses Flonase  in the morning and Afrin at night, occasionally needing Afrin during the day. He acknowledges that his nose gets 'real stopped up' without it, and he is trying to reduce his use of Afrin to avoid dependency.     BP Readings from Last 3 Encounters:  07/11/24 (!) 140/87  09/19/23 137/82  07/08/23 137/87   Lab Results  Component Value Date   NA 140 07/08/2023   K 5.0 07/08/2023   CREATININE 0.91 07/08/2023   EGFR 104 07/08/2023   GLUCOSE 100 (H) 07/08/2023     Medications: Outpatient Medications Prior to Visit  Medication Sig   ACETAMINOPHEN EXTRA STRENGTH 500 MG tablet TAKE 1 TABLET BY MOUTH EVERY 6 HOURS AS NEEDED   famotidine  (PEPCID ) 20 MG tablet TAKE 1  TABLET BY MOUTH TWICE A DAY   fluticasone  (FLONASE ) 50 MCG/ACT nasal spray Place 2 sprays into both nostrils daily. SPRAY 2 SPRAYS INTO EACH NOSTRIL EVERY DAY   HIBICLENS  4 % external liquid APPLY TO AFFECTED AREA EVERY DAY AS NEEDED   meloxicam  (MOBIC ) 15 MG tablet Take 1 tablet (15 mg total) by mouth daily.   tadalafil  (CIALIS ) 10 MG tablet Take 1 tablet by mouth once daily   tadalafil  (CIALIS ) 20 MG tablet Take 1 tablet (20 mg total) by mouth every other day as needed for erectile dysfunction.   triamcinolone  ointment (KENALOG ) 0.5 % Apply 1 Application topically 2 (two) times daily.   amLODipine  (NORVASC ) 10 MG tablet TAKE 1 TABLET BY MOUTH EVERY DAY   No facility-administered medications prior to visit.   Review of Systems  Constitutional:  Negative for appetite change, chills and fever.  Respiratory:  Negative for chest tightness, shortness of breath and wheezing.   Cardiovascular:  Negative for chest pain and palpitations.  Gastrointestinal:  Negative for abdominal pain, nausea and vomiting.       Objective    BP (!) 140/87 (BP Location: Left Arm, Patient Position: Sitting, Cuff Size: Normal)   Pulse 66   Ht 5' 8 (1.727 m)   Wt 206 lb 3.2 oz (93.5 kg)   SpO2 100%   BMI 31.35 kg/m   Physical Exam   General appearance: Well developed, well nourished male, cooperative  and in no acute distress Head: Normocephalic, without obvious abnormality, atraumatic Respiratory: Respirations even and unlabored, normal respiratory rate Extremities: All extremities are intact.  Skin: Skin color, texture, turgor normal. No rashes seen  Psych: Appropriate mood and affect. Neurologic: Mental status: Alert, oriented to person, place, and time, thought content appropriate.    Assessment & Plan    1. Primary hypertension (Primary)  BP not at goal. Compliant with norvasc  10 which was refilled today.   ADD - hydrochlorothiazide  (HYDRODIURIL ) 12.5 MG tablet; Take 1 tablet (12.5 mg total) by  mouth daily.  Dispense: 90 tablet; Refill: 1  Follow up with CPE in November as scheduled.      Nancyann Perry, MD  [facility] [phone] (phone) [phone] (fax)  [facility]

## 2024-07-15 ENCOUNTER — Other Ambulatory Visit: Payer: Self-pay | Admitting: Family Medicine

## 2024-07-15 DIAGNOSIS — N529 Male erectile dysfunction, unspecified: Secondary | ICD-10-CM

## 2024-09-19 ENCOUNTER — Encounter: Payer: Self-pay | Admitting: Family Medicine

## 2024-09-19 ENCOUNTER — Ambulatory Visit (INDEPENDENT_AMBULATORY_CARE_PROVIDER_SITE_OTHER): Payer: Self-pay | Admitting: Family Medicine

## 2024-09-19 VITALS — BP 157/86 | HR 78 | Ht 68.0 in | Wt 208.2 lb

## 2024-09-19 DIAGNOSIS — Z125 Encounter for screening for malignant neoplasm of prostate: Secondary | ICD-10-CM | POA: Diagnosis not present

## 2024-09-19 DIAGNOSIS — R739 Hyperglycemia, unspecified: Secondary | ICD-10-CM

## 2024-09-19 DIAGNOSIS — Z0000 Encounter for general adult medical examination without abnormal findings: Secondary | ICD-10-CM | POA: Diagnosis not present

## 2024-09-19 DIAGNOSIS — I10 Essential (primary) hypertension: Secondary | ICD-10-CM

## 2024-09-19 DIAGNOSIS — Z860101 Personal history of adenomatous and serrated colon polyps: Secondary | ICD-10-CM

## 2024-09-19 DIAGNOSIS — R350 Frequency of micturition: Secondary | ICD-10-CM

## 2024-09-19 NOTE — Progress Notes (Signed)
 Established patient visit   Patient: Jonathan E Defilippo Jr.   DOB: [birth year]   48 y.o. Male  MRN: [id number] Visit Date: 09/19/2024  Today's healthcare provider: Nancyann Perry, MD   Chief Complaint  Patient presents with   Annual Exam    Last Exam Physical: Diet: regular diet  Exercise: lift weights  Feeling:good Sleeping:6-8 hrs  Vaccines decline    Subjective    Discussed the use of AI scribe software for clinical note transcription with the patient, who gave verbal consent to proceed.  History of Present Illness   Jonathan Berg is a 48 year old male with hypertension who presents for annual complete physical.  He has been experiencing difficulty managing his blood pressure due to inconsistent medication adherence. He had stopped taking hydrochlorothiazide  for a period of time and has not been taking his other blood pressure medication regularly. He reports no side effects from hydrochlorothiazide  in the past.  He experiences difficulty initiating urination, particularly after consuming large amounts of water . This symptom has persisted for approximately four to six months. He has not yet started any treatment for this issue.  No chest pain, heart palpitations, or shortness of breath. No changes in bowel habits, although he sometimes has bowel movements a couple of times a day.  He does not consume alcohol and has never been a regular smoker.     Lab Results  Component Value Date   CHOL 178 09/19/2023   HDL 50 09/19/2023   LDLCALC 107 (H) 09/19/2023   TRIG 119 09/19/2023   CHOLHDL 3.6 09/19/2023   Lab Results  Component Value Date   NA 140 07/08/2023   K 5.0 07/08/2023   CREATININE 0.91 07/08/2023   EGFR 104 07/08/2023   GLUCOSE 100 (H) 07/08/2023   Lab Results  Component Value Date   PSA1 3.3 09/19/2023   PSA1 2.6 09/06/2022   PSA1 2.9 08/03/2021   PSA 1.7 09/10/2016   PSA 1.4 07/29/2015   PSA 1.5 11/21/2012      Medications: Outpatient  Medications Prior to Visit  Medication Sig   ACETAMINOPHEN EXTRA STRENGTH 500 MG tablet TAKE 1 TABLET BY MOUTH EVERY 6 HOURS AS NEEDED   amLODipine  (NORVASC ) 10 MG tablet Take 1 tablet (10 mg total) by mouth daily.   famotidine  (PEPCID ) 20 MG tablet TAKE 1 TABLET BY MOUTH TWICE A DAY   fluticasone  (FLONASE ) 50 MCG/ACT nasal spray Place 2 sprays into both nostrils daily. SPRAY 2 SPRAYS INTO EACH NOSTRIL EVERY DAY   HIBICLENS  4 % external liquid APPLY TO AFFECTED AREA EVERY DAY AS NEEDED   hydrochlorothiazide  (HYDRODIURIL ) 12.5 MG tablet Take 1 tablet (12.5 mg total) by mouth daily.   meloxicam  (MOBIC ) 15 MG tablet Take 1 tablet (15 mg total) by mouth daily.   tadalafil  (CIALIS ) 10 MG tablet Take 1 tablet by mouth once daily   tadalafil  (CIALIS ) 20 MG tablet TAKE 1 TABLET (20 MG TOTAL) BY MOUTH EVERY OTHER DAY AS NEEDED FOR ERECTILE DYSFUNCTION.   triamcinolone  ointment (KENALOG ) 0.5 % Apply 1 Application topically 2 (two) times daily.   No facility-administered medications prior to visit.   Review of Systems  Constitutional:  Negative for appetite change, chills and fever.  Respiratory:  Negative for chest tightness, shortness of breath and wheezing.   Cardiovascular:  Negative for chest pain and palpitations.  Gastrointestinal:  Negative for abdominal pain, nausea and vomiting.       Objective    BP ROLLEN)  157/86 (BP Location: Right Arm, Patient Position: Sitting, Cuff Size: Normal)   Pulse 78   Ht 5' 8 (1.727 m)   Wt 208 lb 3.2 oz (94.4 kg)   SpO2 100%   BMI 31.66 kg/m   Physical Exam   General Appearance:    Overweight male. Alert, cooperative, in no acute distress, appears stated age  Head:    Normocephalic, without obvious abnormality, atraumatic  Eyes:    PERRL, conjunctiva/corneas clear, EOM's intact, fundi    benign, both eyes       Ears:    Normal TM's and external ear canals, both ears  Nose:   Nares normal, septum midline, mucosa normal, no drainage   or sinus  tenderness  Throat:   Lips, mucosa, and tongue normal; teeth and gums normal  Neck:   Supple, symmetrical, trachea midline, no adenopathy;       thyroid :  No enlargement/tenderness/nodules; no carotid   bruit or JVD  Back:     Symmetric, no curvature, ROM normal, no CVA tenderness  Lungs:     Clear to auscultation bilaterally, respirations unlabored  Chest wall:    No tenderness or deformity  Heart:    Normal heart rate. Normal rhythm.  1/6 systolic murmur at right upper sternal border S1 and S2 normal  Abdomen:     Soft, non-tender, bowel sounds active all four quadrants,    no masses, no organomegaly  Genitalia:    deferred  Rectal:    deferred  Extremities:   All extremities are intact. No cyanosis or edema  Pulses:   2+ and symmetric all extremities  Skin:   Skin color, texture, turgor normal, no rashes or lesions  Lymph nodes:   Cervical, supraclavicular, and axillary nodes normal  Neurologic:   CNII-XII intact. Normal strength, sensation and reflexes      throughout      Assessment & Plan    1. Annual physical exam (Primary) Generally doing well with normal exam.  Recommend Hep B, prevnar and Shingrix which he declined today.   2. Prostate cancer screening  - PSA Total (Reflex To Free)  3. History of adenomatous polyp of colon Follow up colonoscopy due March 2026. Will place referral before end of this year.   4. Primary hypertension Has been off of hydrochlorothiazide ,but no side effects. Not taking amlodipine . He states he starting back on hydrochlorothiazide  and will start taking amlodipine  consistently.   - CBC - Comprehensive metabolic panel with GFR - Lipid panel  5. Hyperglycemia  - Hemoglobin A1c  6. Urinary frequency Consider tamsulosin if PSA stable, otherwise will refer to urology.        Nancyann Perry, MD  [facility] [phone] (phone) [phone] (fax)  [facility]

## 2024-09-19 NOTE — Patient Instructions (Addendum)
 Please review the attached list of medications and notify my office if there are any errors.   I recommend that you get the Hepatitis B vaccine. You can get this vaccine at most pharmacies, or schedule an appointment to get it at [facility].   I recommend that you get the Prevnar 20 vaccine to protect yourself from certain dangerous strains of pneumonia. You can get Prevnar 20 at your pharmacy, or call our office at [phone] at your earliest convenience to schedule this vaccine.   We will send a referral to Dr. Therisa before your colonoscopy is due in March

## 2024-09-20 LAB — HEMOGLOBIN A1C
Est. average glucose Bld gHb Est-mCnc: 117 mg/dL
Hgb A1c MFr Bld: 5.7 % — ABNORMAL HIGH (ref 4.8–5.6)

## 2024-09-20 LAB — COMPREHENSIVE METABOLIC PANEL WITH GFR
ALT: 41 IU/L (ref 0–44)
AST: 23 IU/L (ref 0–40)
Albumin: 4.8 g/dL (ref 4.1–5.1)
Alkaline Phosphatase: 88 IU/L (ref 47–123)
BUN/Creatinine Ratio: 19 (ref 9–20)
BUN: 18 mg/dL (ref 6–24)
Bilirubin Total: 0.6 mg/dL (ref 0.0–1.2)
CO2: 23 mmol/L (ref 20–29)
Calcium: 10.2 mg/dL (ref 8.7–10.2)
Chloride: 102 mmol/L (ref 96–106)
Creatinine, Ser: 0.95 mg/dL (ref 0.76–1.27)
Globulin, Total: 2.7 g/dL (ref 1.5–4.5)
Glucose: 102 mg/dL — ABNORMAL HIGH (ref 70–99)
Potassium: 4.2 mmol/L (ref 3.5–5.2)
Sodium: 140 mmol/L (ref 134–144)
Total Protein: 7.5 g/dL (ref 6.0–8.5)
eGFR: 98 mL/min/1.73 (ref >59)

## 2024-09-20 LAB — LIPID PANEL
Chol/HDL Ratio: 3.9 ratio (ref 0.0–5.0)
Cholesterol, Total: 199 mg/dL (ref 100–199)
HDL: 51 mg/dL (ref >39)
LDL Chol Calc (NIH): 121 mg/dL — ABNORMAL HIGH (ref 0–99)
Triglycerides: 153 mg/dL — ABNORMAL HIGH (ref 0–149)
VLDL Cholesterol Cal: 27 mg/dL (ref 5–40)

## 2024-09-20 LAB — PSA TOTAL (REFLEX TO FREE): Prostate Specific Ag, Serum: 4 ng/mL (ref 0.0–4.0)

## 2024-09-20 LAB — CBC
Hematocrit: 49 % (ref 37.5–51.0)
Hemoglobin: 15.7 g/dL (ref 13.0–17.7)
MCH: 28.2 pg (ref 26.6–33.0)
MCHC: 32 g/dL (ref 31.5–35.7)
MCV: 88 fL (ref 79–97)
Platelets: 297 x10E3/uL (ref 150–450)
RBC: 5.57 x10E6/uL (ref 4.14–5.80)
RDW: 12.9 % (ref 11.6–15.4)
WBC: 8.2 x10E3/uL (ref 3.4–10.8)

## 2024-09-20 LAB — FPSA% REFLEX
% FREE PSA: 25.8 %
PSA, FREE: 1.03 ng/mL

## 2024-09-23 ENCOUNTER — Ambulatory Visit: Payer: Self-pay | Admitting: Family Medicine

## 2024-09-23 DIAGNOSIS — R972 Elevated prostate specific antigen [PSA]: Secondary | ICD-10-CM | POA: Insufficient documentation

## 2024-10-02 NOTE — Assessment & Plan Note (Signed)
 PSA 4.0 (09/19/24)   - %free 25.8  - from prior baseline of 2-3.3 since 2021  We discussed the significance of an elevated prostate-specific antigen (PSA) level. PSA is a nonspecific marker and may be elevated due to both benign and malignant causes. Benign factors include benign prostatic hyperplasia (BPH), prostatitis or urinary tract infection, recent ejaculation, catheterization or instrumentation, and advancing age. Malignant causes include prostate cancer of varying risk categories.  We reviewed that a single PSA value is less informative than following PSA trends over time, which can better reflect underlying pathology. Risk factors for prostate cancer include increasing age, family history of prostate cancer, African American race, and known germline mutations (e.g., BRCA2).  Next steps may include repeating PSA to confirm elevation, consideration of additional biomarkers or PSA derivatives (e.g., %free PSA, PSA density), and obtaining a multiparametric prostate MRI to assess for suspicious lesions. Based on PSA kinetics, risk profile, and MRI findings, a prostate biopsy may be recommended for definitive diagnosis.

## 2024-10-02 NOTE — Progress Notes (Unsigned)
   10/02/24 4:18 PM   Jonathan Berg. 05/17/1976 [id number]   HPI: 48 y.o. male here for initial evaluation of elevated PSA  PSA 4.0 (09/19/24)   - %free 25.8  - from prior baseline of 2-3.3 since 2021  Fhx+ of prostate Ca in paternal grandfather   PMH: Past Medical History:  Diagnosis Date   History of chicken pox 07/29/2015   DID have Chicken Pox.     Hypertension     Surgical History: Past Surgical History:  Procedure Laterality Date   COLONOSCOPY WITH PROPOFOL  N/A 01/22/2022   Procedure: COLONOSCOPY WITH PROPOFOL ;  Surgeon: Therisa Bi, MD;  Location: [facility] ENDOSCOPY;  Service: Gastroenterology;  Laterality: N/A;   HERNIA REPAIR     Inguinal; removal as a child   History of removal of Cyst  2010   on back   History of wisdom tooth extraction      Family History: Family History  Problem Relation Age of Onset   Hypertension Mother    Hypertension Father    Hemochromatosis Father    Diabetes Maternal Aunt    Cancer Maternal Grandfather    Hemochromatosis Paternal Grandmother    Prostate cancer Paternal Grandfather    Colon cancer Neg Hx     Social History:  reports that he has never smoked. He has never used smokeless tobacco. He reports that he does not drink alcohol and does not use drugs.      Physical Exam: There were no vitals taken for this visit.   Constitutional:  Alert and oriented, No acute distress. Cardiovascular: No clubbing, cyanosis, or edema. Respiratory: Normal respiratory effort, no increased work of breathing. GI: Nondistended GU: *** Skin: No rashes, bruises or suspicious lesions. Neurologic: Grossly intact, no focal deficits, moving all 4 extremities. Psychiatric: Normal mood and affect.  Laboratory Data: Component Ref Range & Units (hover) 13 d ago 1 yr ago 2 yr ago 3 yr ago 4 yr ago  Prostate Specific Ag, Serum 4.0 3.3 CM 2.6 CM 2.9 CM 2.2 CM     Pertinent Imaging: N/A    Assessment & Plan:    Elevated PSA  measurement Assessment & Plan: PSA 4.0 (09/19/24)   - %free 25.8  - from prior baseline of 2-3.3 since 2021  We discussed the significance of an elevated prostate-specific antigen (PSA) level. PSA is a nonspecific marker and may be elevated due to both benign and malignant causes. Benign factors include benign prostatic hyperplasia (BPH), prostatitis or urinary tract infection, recent ejaculation, catheterization or instrumentation, and advancing age. Malignant causes include prostate cancer of varying risk categories.  We reviewed that a single PSA value is less informative than following PSA trends over time, which can better reflect underlying pathology. Risk factors for prostate cancer include increasing age, family history of prostate cancer, African American race, and known germline mutations (e.g., BRCA2).  Next steps may include repeating PSA to confirm elevation, consideration of additional biomarkers or PSA derivatives (e.g., %free PSA, PSA density), and obtaining a multiparametric prostate MRI to assess for suspicious lesions. Based on PSA kinetics, risk profile, and MRI findings, a prostate biopsy may be recommended for definitive diagnosis.          Penne Skye, MD 10/02/2024  [facility] [address] [place], [place] [address] [phone]

## 2024-10-04 ENCOUNTER — Ambulatory Visit: Admitting: Urology

## 2024-10-04 ENCOUNTER — Encounter: Payer: Self-pay | Admitting: Urology

## 2024-10-04 VITALS — BP 191/90 | HR 99 | Ht 68.0 in | Wt 212.2 lb

## 2024-10-04 DIAGNOSIS — R972 Elevated prostate specific antigen [PSA]: Secondary | ICD-10-CM

## 2024-10-06 ENCOUNTER — Other Ambulatory Visit: Payer: Self-pay | Admitting: Family Medicine

## 2024-10-16 ENCOUNTER — Other Ambulatory Visit: Payer: Self-pay | Admitting: Family Medicine

## 2024-10-19 NOTE — Assessment & Plan Note (Addendum)
 PSA 4.4 (10/26/24) PSA 4.0 (09/19/24)   - %free 25.8  - from prior baseline of 2-3.3 since 2021 DRE - 30-40 g gland, symmetric and smooth, no nodules  Reviewed his interval PSA of 4.4, which remains slightly elevated off historic baseline.  Similar to our initial discussion, we will proceed with a dedicated prostate MRI to further characterize.  This would be the most prudent move considering his young age. I explained possible outcomes of this imaging, including next steps which may involve targeted versus systematic prostate biopsy.  All questions answered, patient willing to proceed.  - order Prostate MRI   - if positive lesions - plan for targeted fusion. If negative, plan for 12-core systematic

## 2024-10-19 NOTE — Progress Notes (Signed)
" ° °  10/31/2024 8:31 AM   Jonathan Berg. [birth year] [id number]  Reason for visit: Follow up elevated PSA   HPI: 48 y.o. male, follow up with me today Accompanied by his wife again today  Interval PSA 4.4 (10/26/24)  Prior HPI: PSA 4.0 (09/19/24)   - %free 25.8  - from prior baseline of 2-3.3 since 2021   Accompanied by his wife today Never seen a urologist Denies any recent change in urinary habits, acute LUTS Denies GH, prostatitis, UTIs, nephrolithiasis   Never smoker Fhx+ of prostate Ca in paternal grandfather    Physical Exam: BP (!) 179/77   Pulse 90   Ht 5' 8 (1.727 m)   Wt 209 lb 12.8 oz (95.2 kg)   BMI 31.90 kg/m    Constitutional:  Alert and oriented, No acute distress.  Laboratory Data: Component Ref Range & Units (hover) 5 d ago 1 mo ago 1 yr ago 2 yr ago 3 yr ago 4 yr ago  Prostate Specific Ag, Serum 4.4 High  4.0 CM 3.3 CM 2.6 CM 2.9 CM 2.2 CM    Pertinent Imaging: N/A    Assessment & Plan:    Elevated PSA measurement Assessment & Plan: PSA 4.4 (10/26/24) PSA 4.0 (09/19/24)   - %free 25.8  - from prior baseline of 2-3.3 since 2021 DRE - 30-40 g gland, symmetric and smooth, no nodules  Reviewed his interval PSA of 4.4, which remains slightly elevated off historic baseline.  Similar to our initial discussion, we will proceed with a dedicated prostate MRI to further characterize.  This would be the most prudent move considering his young age. I explained possible outcomes of this imaging, including next steps which may involve targeted versus systematic prostate biopsy.  All questions answered, patient willing to proceed.  - order Prostate MRI   - if positive lesions - plan for targeted fusion. If negative, plan for 12-core systematic  Orders: -     MR PROSTATE W WO CONTRAST; Future       Penne JONELLE Skye, MD  [facility] [address], [address] [place], [place] [address] [phone] "

## 2024-10-26 ENCOUNTER — Other Ambulatory Visit

## 2024-10-26 DIAGNOSIS — R972 Elevated prostate specific antigen [PSA]: Secondary | ICD-10-CM

## 2024-10-27 LAB — PSA: Prostate Specific Ag, Serum: 4.4 ng/mL — ABNORMAL HIGH (ref 0.0–4.0)

## 2024-10-31 ENCOUNTER — Ambulatory Visit: Admitting: Urology

## 2024-10-31 ENCOUNTER — Encounter: Payer: Self-pay | Admitting: Urology

## 2024-10-31 VITALS — BP 179/77 | HR 90 | Ht 68.0 in | Wt 209.8 lb

## 2024-10-31 DIAGNOSIS — R972 Elevated prostate specific antigen [PSA]: Secondary | ICD-10-CM | POA: Diagnosis not present

## 2024-10-31 NOTE — Patient Instructions (Signed)
 Please contact Central Scheduling to set up your prostate MRI at [phone].  Prostate MRI Prep:  1- No ejaculation 48 hours prior to exam  2- No caffeine or carbonated beverages on day of the exam  3- Eat light diet evening prior and day of exam  4- Avoid eating 4 hours prior to exam  5- Fleets enema needs to be done 4 hours prior to exam -See below. Can be purchased at the drug store.     Dr. Georganne will send Prostate MRI results via MyChart Message. Please allow 2-3 weeks for results to be reviewed.

## 2024-11-10 ENCOUNTER — Ambulatory Visit
Admission: RE | Admit: 2024-11-10 | Discharge: 2024-11-10 | Disposition: A | Source: Ambulatory Visit | Attending: Urology

## 2024-11-10 DIAGNOSIS — R972 Elevated prostate specific antigen [PSA]: Secondary | ICD-10-CM | POA: Insufficient documentation

## 2024-11-10 MED ORDER — GADOBUTROL 1 MMOL/ML IV SOLN
9.0000 mL | Freq: Once | INTRAVENOUS | Status: AC | PRN
  Administered 2024-11-10: 9 mL via INTRAVENOUS

## 2024-11-16 ENCOUNTER — Ambulatory Visit: Payer: Self-pay | Admitting: Urology

## 2024-11-16 NOTE — Progress Notes (Signed)
 DJ,   I reviewed your MRI. I agree with the radiologist in that there does appear to be a suspicious area within the Left prostate gland. I would recommend we move forward with a prostate biopsy and obtain a definitive diagnosis. Can I have my team set this up for you, next available?

## 2024-11-21 ENCOUNTER — Telehealth: Payer: Self-pay

## 2024-11-21 NOTE — Telephone Encounter (Signed)
 Called patient to get him scheduled for a Mychart video visit with Dr.Garren. Based on patients availability we scheduled him for 11/29/24 @8 :45am. I informed patient that I will send him the instructions ahead of time via MyChart so he can look over before talking with Dr.Garren. Patient was very thankful of the call and had no further questions or concerns.-Laurin Morgenstern,CMA.

## 2024-11-22 ENCOUNTER — Other Ambulatory Visit: Payer: Self-pay | Admitting: Family Medicine

## 2024-11-22 DIAGNOSIS — Z1211 Encounter for screening for malignant neoplasm of colon: Secondary | ICD-10-CM

## 2024-11-22 DIAGNOSIS — Z860101 Personal history of adenomatous and serrated colon polyps: Secondary | ICD-10-CM

## 2024-11-27 ENCOUNTER — Telehealth: Payer: Self-pay

## 2024-11-27 ENCOUNTER — Other Ambulatory Visit: Payer: Self-pay

## 2024-11-27 DIAGNOSIS — Z860100 Personal history of colon polyps, unspecified: Secondary | ICD-10-CM

## 2024-11-27 MED ORDER — NA SULFATE-K SULFATE-MG SULF 17.5-3.13-1.6 GM/177ML PO SOLN: 1.0000 | Freq: Once | ORAL | 0 refills | Status: AC

## 2024-11-27 NOTE — Assessment & Plan Note (Signed)
 PSA 4.4 (10/26/24) PSA 4.0 (09/19/24)   - %free 25.8  - from prior baseline of 2-3.3 since 2021 DRE - 30-40 g gland, symmetric and smooth, no nodules  MRI prostate (11/10/24) - PIRADS 4 at Left posterolateral PZ, 57g gland  Reviewed his recent MRI results.  I would recommend a targeted MRI fusion biopsy.  Reviewed indications, procedure, possible outcomes and complications.  All questions answered and he was willing to proceed.  - Schedule MRI fusion biopsy, next available.  Patient instructions provided

## 2024-11-27 NOTE — Progress Notes (Unsigned)
" ° °  11/29/2024 8:38 AM   Jonathan Berg. 16-Aug-1976 [id number]  Reason for visit: Follow up elevated PSA   I connected with Jonathan Berg. on 11/29/24 at  8:45 AM EST by audio/video televisit and verified that I am speaking with the correct person using two identifiers.   Patient location: Home Provider location: [facility]  I discussed the limitations, risks, security and privacy concerns of performing an evaluation and management service by telephone and the availability of in person appointments. We discussed the impact of the COVID-19 pandemic on the healthcare system, and the importance of social distancing and reducing patient and provider exposure. I also discussed with the patient that there may be a patient responsible charge related to this service. The patient expressed understanding and agreed to proceed.  I provided 20 minutes of time during this virtual encounter.  -----------------------------------------------------------------------------------------------------------------------    HPI: 49 y.o. male, follow up with me today  MRI prostate (11/10/24) - PIRADS 4 at Left posterolateral PZ, 57g gland  Prior HPI: Interval PSA 4.4 (10/26/24)  PSA 4.0 (09/19/24)   - %free 25.8  - from prior baseline of 2-3.3 since 2021   Accompanied by his wife today Never seen a urologist Denies any recent change in urinary habits, acute LUTS Denies GH, prostatitis, UTIs, nephrolithiasis   Never smoker Fhx+ of prostate Ca in paternal grandfather    Physical Exam: N/A  Laboratory Data: Component Ref Range & Units (hover) 1 mo ago 2 mo ago 1 yr ago 2 yr ago 3 yr ago 4 yr ago  Prostate Specific Ag, Serum 4.4 High  4.0 CM 3.3 CM 2.6 CM 2.9 CM 2.2 CM    Pertinent Imaging: I have personally viewed and interpreted the MRI prostate (11/10/24) -agree with read, PI-RADS 4 lesion at left posterior lateral PZ IMPRESSION: 1. Small PI-RADS category 4 lesion in the  left posteromedial and left posterolateral peripheral zone at the base, measuring 0.30 cc. Targeting data sent to the [place] system. 2. Hazy low T2 signal in the peripheral zone, likely postinflammatory, PI-RADS category 2. 3. Encapsulated nodularity in the transition zone compatible with benign prostatic hypertrophy. 4. Prostate volume of 57.1 cc..    Assessment & Plan:    Elevated PSA measurement Assessment & Plan: PSA 4.4 (10/26/24) PSA 4.0 (09/19/24)   - %free 25.8  - from prior baseline of 2-3.3 since 2021 DRE - 30-40 g gland, symmetric and smooth, no nodules  MRI prostate (11/10/24) - PIRADS 4 at Left posterolateral PZ, 57g gland  Reviewed his recent MRI results.  I would recommend a targeted MRI fusion biopsy.  Reviewed indications, procedure, possible outcomes and complications.  All questions answered and he was willing to proceed.  - Schedule MRI fusion biopsy, next available.  Patient instructions provided        Penne JONELLE Skye, MD  [facility] [address] [place], [place] [address] [phone] "

## 2024-11-29 ENCOUNTER — Telehealth: Admitting: Urology

## 2024-11-29 DIAGNOSIS — R972 Elevated prostate specific antigen [PSA]: Secondary | ICD-10-CM

## 2024-11-29 NOTE — Patient Instructions (Signed)
 Transrectal Prostate Biopsy/Fusion Biopsy Patient Education and Post Procedure Instructions    -Definition A prostate biopsy is the removal of a small amount of tissue from the prostate gland. The tissue is examined to determine whether there is cancer.  -Reasons for Procedure A prostate biopsy is usually done after an abnormal finding by: Digital rectal exam Prostate specific antigen (PSA) blood test A prostate biopsy is the only way to find out if cancer cells are present.  -Possible Complications Problems from the procedure are rare, but all procedures have some risk including: Infection Bruising or lengthy bleeding from the rectum, or in urine or semen Difficulty urinating Reactions to anesthesia Factors that may increase the risk of complications include: Smoking History of bleeding disorders or easy bruising Use of any medications, over-the-counter medications, or herbal supplements Sensitivity or allergy to latex, medications, or anesthesia.  -Prior to Procedure Talk to your doctor about your medications. Blood thinning medications including aspirin should be stopped 1 week prior to procedure. If prescribed by your cardiologist we may need approval before stopping medications. Use a Fleets enema 2 hours before the procedure. Can be purchased at your pharmacy. Antibiotics will be administered in the clinic prior to procedure.  Please make sure you eat a light meal prior to coming in for your appointment. This can help prevent lightheadedness during the procedure and upset stomach from antibiotics. Please bring someone with you to the procedure to drive you home.  -Anesthesia Transrectal biopsy: Local anesthesia--Just the area that is being operated on is numbed using an injectable anesthetic.  -Description of the Procedure Transrectal biopsy--Your doctor will insert a small ultrasound device into the rectum. This device will produce sound waves to create an image of the  prostate. These images will help guide placement of the needle. Your doctor will then insert the needle through the wall of the rectum and into the prostate gland. The procedure should take approximately 15-30 minutes.  -Will It Hurt? You may have discomfort and soreness at the biopsy site. Pain and discomfort after the procedure can be managed with medications.  -Postoperative Care When you return home after the procedure, do the following to help ensure a smooth recovery: Stay hydrated. Drink plenty of fluids for the next few days. Avoid difficult physical activity the day and evening of the procedure. Keep in mind that you may see blood in your urine, stool, or semen for several days. Resume any medications that were stopped when you are advised to do so.  After the sample is taken, it will be sent to a pathologist for examination under a microscope. This doctor will analyze the sample for cancer. You will be scheduled for an appointment to discuss results. If cancer is present, your doctor will work with you to develop a treatment plan.   -Call Your Doctor or Seek Immediate Medical Attention It is important to monitor your recovery. Alert your doctor to any problems. If any of the following occur, call your doctor or go to the emergency room: Fever 100.5 or greater within 1 week post procedure go directly to ER Call the office for: Blood in the urine more than 1 week or in semen for more than 6 weeks post-biopsy Pain that you cannot control with the medications you have been given Pain, burning, urgency, or frequency of urination Cough, shortness of breath, or chest pain- if severe go to ER Heavy rectal bleeding or bleeding that lasts more than 1 week after the biopsy If you have  any questions or concerns please contact our office at [phone]  [facility]  [address]  [place] , [place], [address] [phone]

## 2025-01-10 ENCOUNTER — Other Ambulatory Visit: Admitting: Urology

## 2025-01-17 ENCOUNTER — Ambulatory Visit: Admitting: Urology

## 2025-02-01 ENCOUNTER — Ambulatory Visit: Admit: 2025-02-01
